# Patient Record
Sex: Male | Born: 1960 | Race: White | Hispanic: No | Marital: Married | Smoking: Former smoker
Health system: Southern US, Community
[De-identification: ages and names within clinical notes are randomized; demographics above are authoritative.]

## PROBLEM LIST (undated history)

## (undated) DIAGNOSIS — I209 Angina pectoris, unspecified: Secondary | ICD-10-CM

## (undated) DIAGNOSIS — I252 Old myocardial infarction: Secondary | ICD-10-CM

## (undated) DIAGNOSIS — K8020 Calculus of gallbladder without cholecystitis without obstruction: Secondary | ICD-10-CM

## (undated) DIAGNOSIS — I10 Essential (primary) hypertension: Secondary | ICD-10-CM

## (undated) DIAGNOSIS — I2510 Atherosclerotic heart disease of native coronary artery without angina pectoris: Secondary | ICD-10-CM

## (undated) DIAGNOSIS — Z8249 Family history of ischemic heart disease and other diseases of the circulatory system: Secondary | ICD-10-CM

## (undated) DIAGNOSIS — I219 Acute myocardial infarction, unspecified: Secondary | ICD-10-CM

## (undated) DIAGNOSIS — Z955 Presence of coronary angioplasty implant and graft: Secondary | ICD-10-CM

## (undated) DIAGNOSIS — E785 Hyperlipidemia, unspecified: Secondary | ICD-10-CM

## (undated) DIAGNOSIS — I739 Peripheral vascular disease, unspecified: Secondary | ICD-10-CM

## (undated) DIAGNOSIS — F17200 Nicotine dependence, unspecified, uncomplicated: Secondary | ICD-10-CM

## (undated) HISTORY — DX: Family history of ischemic heart disease and other diseases of the circulatory system: Z82.49

## (undated) HISTORY — DX: Presence of coronary angioplasty implant and graft: Z95.5

## (undated) HISTORY — DX: Hyperlipidemia, unspecified: E78.5

## (undated) HISTORY — DX: Calculus of gallbladder without cholecystitis without obstruction: K80.20

## (undated) HISTORY — PX: APPENDECTOMY: SHX54

## (undated) HISTORY — PX: CORONARY ANGIOPLASTY: SHX604

## (undated) HISTORY — DX: Old myocardial infarction: I25.2

## (undated) HISTORY — DX: Angina pectoris, unspecified: I20.9

## (undated) HISTORY — DX: Nicotine dependence, unspecified, uncomplicated: F17.200

## (undated) HISTORY — DX: Peripheral vascular disease, unspecified: I73.9

---

## 2003-07-02 ENCOUNTER — Ambulatory Visit: Admission: RE | Admit: 2003-07-02 | Discharge: 2003-07-02 | Payer: Self-pay | Admitting: *Deleted

## 2011-09-17 ENCOUNTER — Other Ambulatory Visit: Payer: Self-pay | Admitting: Neurosurgery

## 2011-09-18 ENCOUNTER — Encounter (HOSPITAL_COMMUNITY): Payer: Self-pay | Admitting: Pharmacy Technician

## 2011-09-25 ENCOUNTER — Encounter (HOSPITAL_COMMUNITY): Payer: Self-pay

## 2011-09-25 ENCOUNTER — Encounter (HOSPITAL_COMMUNITY)
Admission: RE | Admit: 2011-09-25 | Discharge: 2011-09-25 | Disposition: A | Discharge status: Home / Self Care | Payer: Worker's Compensation | Source: Ambulatory Visit | Attending: Neurosurgery | Admitting: Neurosurgery

## 2011-09-25 ENCOUNTER — Other Ambulatory Visit: Payer: Self-pay

## 2011-09-25 ENCOUNTER — Encounter (HOSPITAL_COMMUNITY)
Admission: RE | Admit: 2011-09-25 | Discharge: 2011-09-25 | Disposition: A | Discharge status: Home / Self Care | Payer: Worker's Compensation | Source: Ambulatory Visit | Attending: Anesthesiology | Admitting: Anesthesiology

## 2011-09-25 HISTORY — DX: Essential (primary) hypertension: I10

## 2011-09-25 HISTORY — DX: Atherosclerotic heart disease of native coronary artery without angina pectoris: I25.10

## 2011-09-25 HISTORY — DX: Acute myocardial infarction, unspecified: I21.9

## 2011-09-25 HISTORY — DX: Hyperlipidemia, unspecified: E78.5

## 2011-09-25 LAB — CBC
HCT: 43 % (ref 39.0–52.0)
Hemoglobin: 14.8 g/dL (ref 13.0–17.0)
MCH: 33.5 pg (ref 26.0–34.0)
MCHC: 34.4 g/dL (ref 30.0–36.0)
MCV: 97.3 fL (ref 78.0–100.0)
Platelets: 221 10*3/uL (ref 150–400)
RBC: 4.42 MIL/uL (ref 4.22–5.81)
RDW: 12.8 % (ref 11.5–15.5)
WBC: 7.1 10*3/uL (ref 4.0–10.5)

## 2011-09-25 LAB — SURGICAL PCR SCREEN
MRSA, PCR: NEGATIVE
Staphylococcus aureus: NEGATIVE

## 2011-09-25 LAB — BASIC METABOLIC PANEL
BUN: 9 mg/dL (ref 6–23)
CO2: 28 mEq/L (ref 19–32)
Calcium: 9.6 mg/dL (ref 8.4–10.5)
Chloride: 104 mEq/L (ref 96–112)
Creatinine, Ser: 0.91 mg/dL (ref 0.50–1.35)
GFR calc Af Amer: >90 mL/min (ref >90)
GFR calc non Af Amer: >90 mL/min (ref >90)
Glucose, Bld: 120 mg/dL — ABNORMAL HIGH (ref 70–99)
Potassium: 5.1 mEq/L (ref 3.5–5.1)
Sodium: 139 mEq/L (ref 135–145)

## 2011-09-25 NOTE — Pre-Procedure Instructions (Signed)
20 Jonathan Fleming [facility]  09/25/2011   Your procedure is scheduled on:  October 01, 2011  Report to [facility] at 0530 AM.  Call this number if you have problems the morning of surgery: [phone]   Remember:   Do not eat food:After Midnight.  May have clear liquids: up to 4 Hours before arrival.  Clear liquids include soda, tea, black coffee, apple or grape juice, broth.  Take these medicines the morning of surgery with A SIP OF WATER: Dilaudid, Metoprolol   STOP Lecithin, Multiple Vitamins, Fish Oil, Aspirin today  Do not wear jewelry, make-up or nail polish.  Do not wear lotions, powders, or perfumes. You may wear deodorant.  Do not shave 48 hours prior to surgery.  Do not bring valuables to the hospital.  Contacts, dentures or bridgework may not be worn into surgery.  Leave suitcase in the car. After surgery it may be brought to your room.  For patients admitted to the hospital, checkout time is 11:00 AM the day of discharge.   Patients discharged the day of surgery will not be allowed to drive home.  Name and phone number of your driver: Jonathan Fleming [phone]  Special Instructions: CHG Shower Use Special Wash: 1/2 bottle night before surgery and 1/2 bottle morning of surgery.   Please read over the following fact sheets that you were given: Pain Booklet, Coughing and Deep Breathing, MRSA Information and Surgical Site Infection Prevention

## 2011-09-25 NOTE — Progress Notes (Signed)
Records requested from [facility] in [place] most recent notes from ~ 1.5 years ago, Stress Test requested from [facility], records requested from pt's PCP office Dr. [facility] most recent visit notes.

## 2011-10-01 ENCOUNTER — Encounter (HOSPITAL_COMMUNITY): Payer: Self-pay | Admitting: Anesthesiology

## 2011-10-01 ENCOUNTER — Ambulatory Visit (HOSPITAL_COMMUNITY)
Admission: RE | Admit: 2011-10-01 | Discharge: 2011-10-01 | Disposition: A | Discharge status: Home / Self Care | Payer: Worker's Compensation | Source: Ambulatory Visit | Attending: Neurosurgery | Admitting: Neurosurgery

## 2011-10-01 ENCOUNTER — Encounter (HOSPITAL_COMMUNITY)
Admission: RE | Disposition: A | Discharge status: Home / Self Care | Payer: Self-pay | Source: Ambulatory Visit | Attending: Neurosurgery

## 2011-10-01 ENCOUNTER — Ambulatory Visit (HOSPITAL_COMMUNITY): Payer: Worker's Compensation

## 2011-10-01 ENCOUNTER — Encounter (HOSPITAL_COMMUNITY): Payer: Self-pay | Admitting: *Deleted

## 2011-10-01 ENCOUNTER — Ambulatory Visit (HOSPITAL_COMMUNITY): Payer: Worker's Compensation | Admitting: Anesthesiology

## 2011-10-01 DIAGNOSIS — E785 Hyperlipidemia, unspecified: Secondary | ICD-10-CM | POA: Insufficient documentation

## 2011-10-01 DIAGNOSIS — I10 Essential (primary) hypertension: Secondary | ICD-10-CM | POA: Insufficient documentation

## 2011-10-01 DIAGNOSIS — M5126 Other intervertebral disc displacement, lumbar region: Secondary | ICD-10-CM | POA: Insufficient documentation

## 2011-10-01 DIAGNOSIS — I252 Old myocardial infarction: Secondary | ICD-10-CM | POA: Insufficient documentation

## 2011-10-01 DIAGNOSIS — I2510 Atherosclerotic heart disease of native coronary artery without angina pectoris: Secondary | ICD-10-CM | POA: Insufficient documentation

## 2011-10-01 DIAGNOSIS — Z23 Encounter for immunization: Secondary | ICD-10-CM | POA: Insufficient documentation

## 2011-10-01 DIAGNOSIS — M5116 Intervertebral disc disorders with radiculopathy, lumbar region: Secondary | ICD-10-CM

## 2011-10-01 HISTORY — PX: LUMBAR LAMINECTOMY/DECOMPRESSION MICRODISCECTOMY: SHX5026

## 2011-10-01 SURGERY — LUMBAR LAMINECTOMY/DECOMPRESSION MICRODISCECTOMY 1 LEVEL
Anesthesia: General | Site: Back | Laterality: Right | Wound class: Clean

## 2011-10-01 MED ORDER — PANTOPRAZOLE SODIUM 40 MG PO TBEC: 80.0000 mg | DELAYED_RELEASE_TABLET | Freq: Every day | ORAL | Status: DC

## 2011-10-01 MED ORDER — ADULT MULTIVITAMIN W/MINERALS CH: 1.0000 | ORAL_TABLET | Freq: Every day | ORAL | Status: DC

## 2011-10-01 MED ORDER — FENTANYL CITRATE 0.05 MG/ML IJ SOLN
INTRAMUSCULAR | Status: DC | PRN
  Administered 2011-10-01 (×2): 100 ug via INTRAVENOUS
  Administered 2011-10-01: 50 ug via INTRAVENOUS

## 2011-10-01 MED ORDER — CEFAZOLIN SODIUM 1-5 GM-% IV SOLN
INTRAVENOUS | Status: AC
  Administered 2011-10-01: 1 g via INTRAVENOUS
  Filled 2011-10-01: qty 50

## 2011-10-01 MED ORDER — EZETIMIBE-SIMVASTATIN 10-20 MG PO TABS
1.0000 | ORAL_TABLET | Freq: Every day | ORAL | Status: DC
  Filled 2011-10-01: qty 1

## 2011-10-01 MED ORDER — SODIUM CHLORIDE 0.9 % IJ SOLN
3.0000 mL | Freq: Two times a day (BID) | INTRAMUSCULAR | Status: DC
  Administered 2011-10-01: 3 mL via INTRAVENOUS

## 2011-10-01 MED ORDER — CEFAZOLIN SODIUM 1-5 GM-% IV SOLN: 1.0000 g | INTRAVENOUS | Status: DC

## 2011-10-01 MED ORDER — SODIUM CHLORIDE 0.9 % IJ SOLN: 3.0000 mL | INTRAMUSCULAR | Status: DC | PRN

## 2011-10-01 MED ORDER — LACTATED RINGERS IV SOLN
INTRAVENOUS | Status: DC | PRN
  Administered 2011-10-01 (×2): via INTRAVENOUS

## 2011-10-01 MED ORDER — DSS 100 MG PO CAPS: 100.0000 mg | ORAL_CAPSULE | Freq: Two times a day (BID) | ORAL | Status: AC

## 2011-10-01 MED ORDER — PROPOFOL 10 MG/ML IV EMUL
INTRAVENOUS | Status: DC | PRN
  Administered 2011-10-01: 200 mg via INTRAVENOUS

## 2011-10-01 MED ORDER — NEOSTIGMINE METHYLSULFATE 1 MG/ML IJ SOLN
INTRAMUSCULAR | Status: DC | PRN
  Administered 2011-10-01: 4 mg via INTRAVENOUS

## 2011-10-01 MED ORDER — SODIUM CHLORIDE 0.9 % IV SOLN
INTRAVENOUS | Status: AC
  Filled 2011-10-01: qty 500

## 2011-10-01 MED ORDER — METOPROLOL TARTRATE 100 MG PO TABS
100.0000 mg | ORAL_TABLET | Freq: Two times a day (BID) | ORAL | Status: DC
  Filled 2011-10-01: qty 1

## 2011-10-01 MED ORDER — DOCUSATE SODIUM 100 MG PO CAPS: 100.0000 mg | ORAL_CAPSULE | Freq: Two times a day (BID) | ORAL | Status: DC

## 2011-10-01 MED ORDER — ACETAMINOPHEN 650 MG RE SUPP: 650.0000 mg | RECTAL | Status: DC | PRN

## 2011-10-01 MED ORDER — HEMOSTATIC AGENTS (NO CHARGE) OPTIME
TOPICAL | Status: DC | PRN
  Administered 2011-10-01: 1 via TOPICAL

## 2011-10-01 MED ORDER — SODIUM CHLORIDE 0.9 % IR SOLN
Status: DC | PRN
  Administered 2011-10-01: 08:00:00

## 2011-10-01 MED ORDER — ZOLPIDEM TARTRATE 10 MG PO TABS: 10.0000 mg | ORAL_TABLET | Freq: Every evening | ORAL | Status: DC | PRN

## 2011-10-01 MED ORDER — ENALAPRIL MALEATE 10 MG PO TABS: 10.0000 mg | ORAL_TABLET | Freq: Every day | ORAL | Status: DC

## 2011-10-01 MED ORDER — THROMBIN 5000 UNITS EX KIT
PACK | CUTANEOUS | Status: DC | PRN
  Administered 2011-10-01 (×2): 5000 [IU] via TOPICAL

## 2011-10-01 MED ORDER — CEFAZOLIN SODIUM 1-5 GM-% IV SOLN
1.0000 g | Freq: Three times a day (TID) | INTRAVENOUS | Status: DC
  Filled 2011-10-01: qty 50

## 2011-10-01 MED ORDER — MORPHINE SULFATE 4 MG/ML IJ SOLN
1.0000 mg | INTRAMUSCULAR | Status: DC | PRN
  Administered 2011-10-01: 4 mg via INTRAVENOUS
  Filled 2011-10-01: qty 1

## 2011-10-01 MED ORDER — GLYCOPYRROLATE 0.2 MG/ML IJ SOLN
INTRAMUSCULAR | Status: DC | PRN
  Administered 2011-10-01: .6 mg via INTRAVENOUS

## 2011-10-01 MED ORDER — LACTATED RINGERS IV SOLN: INTRAVENOUS | Status: DC

## 2011-10-01 MED ORDER — PNEUMOCOCCAL VAC POLYVALENT 25 MCG/0.5ML IJ INJ
0.5000 mL | INJECTION | Freq: Once | INTRAMUSCULAR | Status: AC
  Administered 2011-10-01: 0.5 mL via INTRAMUSCULAR
  Filled 2011-10-01: qty 0.5

## 2011-10-01 MED ORDER — PHENOL 1.4 % MT LIQD: 1.0000 | OROMUCOSAL | Status: DC | PRN

## 2011-10-01 MED ORDER — DIAZEPAM 5 MG PO TABS: 5.0000 mg | ORAL_TABLET | Freq: Four times a day (QID) | ORAL | Status: AC | PRN

## 2011-10-01 MED ORDER — MENTHOL 3 MG MT LOZG: 1.0000 | LOZENGE | OROMUCOSAL | Status: DC | PRN

## 2011-10-01 MED ORDER — HYDROMORPHONE HCL PF 1 MG/ML IJ SOLN: 0.2500 mg | INTRAMUSCULAR | Status: DC | PRN

## 2011-10-01 MED ORDER — ONDANSETRON HCL 4 MG/2ML IJ SOLN: 4.0000 mg | Freq: Four times a day (QID) | INTRAMUSCULAR | Status: DC | PRN

## 2011-10-01 MED ORDER — ROCURONIUM BROMIDE 100 MG/10ML IV SOLN
INTRAVENOUS | Status: DC | PRN
  Administered 2011-10-01: 50 mg via INTRAVENOUS

## 2011-10-01 MED ORDER — BACITRACIN ZINC 500 UNIT/GM EX OINT
TOPICAL_OINTMENT | CUTANEOUS | Status: DC | PRN
  Administered 2011-10-01: 1 via TOPICAL

## 2011-10-01 MED ORDER — LIDOCAINE HCL (CARDIAC) 20 MG/ML IV SOLN
INTRAVENOUS | Status: DC | PRN
  Administered 2011-10-01: 50 mg via INTRAVENOUS

## 2011-10-01 MED ORDER — ACETAMINOPHEN 325 MG PO TABS: 650.0000 mg | ORAL_TABLET | ORAL | Status: DC | PRN

## 2011-10-01 MED ORDER — OXYCODONE-ACETAMINOPHEN 5-325 MG PO TABS
1.0000 | ORAL_TABLET | ORAL | Status: DC | PRN
  Administered 2011-10-01: 2 via ORAL
  Filled 2011-10-01: qty 2

## 2011-10-01 MED ORDER — MIDAZOLAM HCL 5 MG/5ML IJ SOLN
INTRAMUSCULAR | Status: DC | PRN
  Administered 2011-10-01: 2 mg via INTRAVENOUS

## 2011-10-01 MED ORDER — BACITRACIN 50000 UNITS IM SOLR
INTRAMUSCULAR | Status: AC
  Filled 2011-10-01: qty 1

## 2011-10-01 MED ORDER — ONDANSETRON HCL 4 MG/2ML IJ SOLN
INTRAMUSCULAR | Status: DC | PRN
  Administered 2011-10-01: 4 mg via INTRAVENOUS

## 2011-10-01 MED ORDER — DIAZEPAM 5 MG PO TABS: 5.0000 mg | ORAL_TABLET | Freq: Four times a day (QID) | ORAL | Status: DC | PRN

## 2011-10-01 MED ORDER — OXYCODONE-ACETAMINOPHEN 10-325 MG PO TABS: 1.0000 | ORAL_TABLET | ORAL | Status: AC | PRN

## 2011-10-01 MED ORDER — BUPIVACAINE-EPINEPHRINE PF 0.5-1:200000 % IJ SOLN
INTRAMUSCULAR | Status: DC | PRN
  Administered 2011-10-01: 10 mL

## 2011-10-01 MED ORDER — ONDANSETRON HCL 4 MG/2ML IJ SOLN: 4.0000 mg | INTRAMUSCULAR | Status: DC | PRN

## 2011-10-01 MED ORDER — 0.9 % SODIUM CHLORIDE (POUR BTL) OPTIME
TOPICAL | Status: DC | PRN
  Administered 2011-10-01: 1000 mL

## 2011-10-01 MED ORDER — VANCOMYCIN HCL IN DEXTROSE 1-5 GM/200ML-% IV SOLN
1000.0000 mg | Freq: Two times a day (BID) | INTRAVENOUS | Status: DC
  Filled 2011-10-01 (×2): qty 200

## 2011-10-01 SURGICAL SUPPLY — 53 items
BAG DECANTER FOR FLEXI CONT (MISCELLANEOUS) ×2 IMPLANT
BENZOIN TINCTURE PRP APPL 2/3 (GAUZE/BANDAGES/DRESSINGS) ×2 IMPLANT
BLADE SURG ROTATE 9660 (MISCELLANEOUS) ×2 IMPLANT
BRUSH SCRUB EZ PLAIN DRY (MISCELLANEOUS) ×2 IMPLANT
BUR ACORN 6.0 (BURR) ×2 IMPLANT
BUR MATCHSTICK NEURO 3.0 LAGG (BURR) ×2 IMPLANT
CANISTER SUCTION 2500CC (MISCELLANEOUS) ×2 IMPLANT
CLOTH BEACON ORANGE TIMEOUT ST (SAFETY) ×2 IMPLANT
CONT SPEC 4OZ CLIKSEAL STRL BL (MISCELLANEOUS) ×2 IMPLANT
DRAPE LAPAROTOMY 100X72X124 (DRAPES) ×2 IMPLANT
DRAPE MICROSCOPE LEICA (MISCELLANEOUS) ×2 IMPLANT
DRAPE POUCH INSTRU U-SHP 10X18 (DRAPES) ×2 IMPLANT
DRAPE SURG 17X23 STRL (DRAPES) ×8 IMPLANT
ELECT BLADE 4.0 EZ CLEAN MEGAD (MISCELLANEOUS) ×2
ELECT REM PT RETURN 9FT ADLT (ELECTROSURGICAL) ×2
ELECTRODE BLDE 4.0 EZ CLN MEGD (MISCELLANEOUS) ×1 IMPLANT
ELECTRODE REM PT RTRN 9FT ADLT (ELECTROSURGICAL) ×1 IMPLANT
GAUZE SPONGE 4X4 16PLY XRAY LF (GAUZE/BANDAGES/DRESSINGS) IMPLANT
GLOVE BIO SURGEON STRL SZ8.5 (GLOVE) ×2 IMPLANT
GLOVE BIOGEL PI IND STRL 8.5 (GLOVE) ×1 IMPLANT
GLOVE BIOGEL PI INDICATOR 8.5 (GLOVE) ×1
GLOVE ECLIPSE 7.5 STRL STRAW (GLOVE) ×2 IMPLANT
GLOVE EXAM NITRILE LRG STRL (GLOVE) IMPLANT
GLOVE EXAM NITRILE MD LF STRL (GLOVE) ×2 IMPLANT
GLOVE EXAM NITRILE XL STR (GLOVE) IMPLANT
GLOVE EXAM NITRILE XS STR PU (GLOVE) IMPLANT
GLOVE SS BIOGEL STRL SZ 8 (GLOVE) ×1 IMPLANT
GLOVE SUPERSENSE BIOGEL SZ 8 (GLOVE) ×1
GLOVE SURG SS PI 8.0 STRL IVOR (GLOVE) ×6 IMPLANT
GOWN BRE IMP SLV AUR LG STRL (GOWN DISPOSABLE) IMPLANT
GOWN BRE IMP SLV AUR XL STRL (GOWN DISPOSABLE) ×4 IMPLANT
GOWN STRL REIN 2XL LVL4 (GOWN DISPOSABLE) ×2 IMPLANT
KIT BASIN OR (CUSTOM PROCEDURE TRAY) ×2 IMPLANT
KIT ROOM TURNOVER OR (KITS) ×2 IMPLANT
NEEDLE HYPO 21X1.5 SAFETY (NEEDLE) IMPLANT
NEEDLE HYPO 22GX1.5 SAFETY (NEEDLE) ×4 IMPLANT
NS IRRIG 1000ML POUR BTL (IV SOLUTION) ×2 IMPLANT
PACK LAMINECTOMY NEURO (CUSTOM PROCEDURE TRAY) ×2 IMPLANT
PAD ARMBOARD 7.5X6 YLW CONV (MISCELLANEOUS) ×6 IMPLANT
PATTIES SURGICAL .5 X1 (DISPOSABLE) IMPLANT
RUBBERBAND STERILE (MISCELLANEOUS) ×4 IMPLANT
SPONGE GAUZE 4X4 12PLY (GAUZE/BANDAGES/DRESSINGS) ×2 IMPLANT
SPONGE SURGIFOAM ABS GEL SZ50 (HEMOSTASIS) ×2 IMPLANT
STRIP CLOSURE SKIN 1/2X4 (GAUZE/BANDAGES/DRESSINGS) ×2 IMPLANT
SUT VIC AB 1 CT1 18XBRD ANBCTR (SUTURE) ×1 IMPLANT
SUT VIC AB 1 CT1 8-18 (SUTURE) ×1
SUT VIC AB 2-0 CP2 18 (SUTURE) ×2 IMPLANT
SYR 20CC LL (SYRINGE) IMPLANT
SYR 20ML ECCENTRIC (SYRINGE) ×2 IMPLANT
TAPE CLOTH SURG 4X10 WHT LF (GAUZE/BANDAGES/DRESSINGS) ×2 IMPLANT
TOWEL OR 17X24 6PK STRL BLUE (TOWEL DISPOSABLE) ×2 IMPLANT
TOWEL OR 17X26 10 PK STRL BLUE (TOWEL DISPOSABLE) ×2 IMPLANT
WATER STERILE IRR 1000ML POUR (IV SOLUTION) ×2 IMPLANT

## 2011-10-01 NOTE — Discharge Summary (Signed)
Physician Discharge Summary  Patient ID: Jonathan Fleming MRN: [id number] DOB/AGE: 19-May-1961 51 y.o.  Admit date: 10/01/2011 Discharge date: 10/01/2011  Admission Diagnoses: L2-3 ruptured disc.  Discharge Diagnoses: The same Active Problems:  * No active hospital problems. *    Discharged Condition: Good  Hospital Course: The patient was admitted to [facility] on 10/01/2011. On that day I performed a right L2-3 discectomy. The surgery were well. The remainder of the patient's postoperative course was unremarkable and he requested discharge to home on 10/01/2011. He was discharged  Consults: None Significant Diagnostic Studies: None Treatments: L2-3 discectomy Discharge Exam: Blood pressure 159/83, pulse 72, temperature 98 F (36.7 C), temperature source Oral, resp. rate 20, SpO2 100.00%. The patient is alert and oriented. His dressing is clean and dry. His strength is normal.  Disposition: Home  Discharge Orders    Future Orders Please Complete By Expires   Diet - low sodium heart healthy      Increase activity slowly      Discharge instructions      Comments:   Call [phone] for a followup appointment.    Remove dressing in 72 hours      Call MD for:  temperature >100.4      Call MD for:  persistant nausea and vomiting      Call MD for:  severe uncontrolled pain      Call MD for:  redness, tenderness, or signs of infection (pain, swelling, redness, odor or green/yellow discharge around incision site)      Call MD for:  difficulty breathing, headache or visual disturbances      Call MD for:  hives      Call MD for:  persistant dizziness or light-headedness      Call MD for:  extreme fatigue        Medication List  As of 10/01/2011  2:30 PM   STOP taking these medications         HYDROmorphone 2 MG tablet         TAKE these medications         aspirin 325 MG tablet   Take 325 mg by mouth daily.      diazepam 5 MG tablet   Commonly known as: VALIUM     Take 1 tablet (5 mg total) by mouth every 6 (six) hours as needed.      DSS 100 MG Caps   Take 100 mg by mouth 2 (two) times daily.      enalapril 10 MG tablet   Commonly known as: VASOTEC   Take 10 mg by mouth daily.      esomeprazole 40 MG capsule   Commonly known as: NEXIUM   Take 40 mg by mouth daily before breakfast.      ezetimibe-simvastatin 10-20 MG per tablet   Commonly known as: VYTORIN   Take 1 tablet by mouth at bedtime.      Fish Oil 1200 MG Caps   Take 2 capsules by mouth daily.      Lecithin 1000 MG Chew   Chew 1 tablet by mouth daily.      metoprolol 100 MG tablet   Commonly known as: LOPRESSOR   Take 100 mg by mouth 2 (two) times daily.      mulitivitamin with minerals Tabs   Take 1 tablet by mouth daily.      oxyCODONE-acetaminophen 10-325 MG per tablet   Commonly known as: PERCOCET   Take 1  tablet by mouth every 4 (four) hours as needed for pain.             SignedCristi Loron 10/01/2011, 2:30 PM

## 2011-10-01 NOTE — Anesthesia Preprocedure Evaluation (Signed)
Anesthesia Evaluation  Patient identified by MRN, date of birth, ID band Patient awake    Reviewed: Allergy & Precautions, H&P , NPO status , Patient's Chart, lab work & pertinent test results  Airway Mallampati: II  Neck ROM: full    Dental   Pulmonary Current Smoker,          Cardiovascular hypertension, + CAD and + Past MI     Neuro/Psych    GI/Hepatic   Endo/Other    Renal/GU      Musculoskeletal   Abdominal   Peds  Hematology   Anesthesia Other Findings   Reproductive/Obstetrics                           Anesthesia Physical Anesthesia Plan  ASA: III  Anesthesia Plan: General   Post-op Pain Management:    Induction: Intravenous  Airway Management Planned: Oral ETT  Additional Equipment:   Intra-op Plan:   Post-operative Plan: Extubation in OR  Informed Consent: I have reviewed the patients History and Physical, chart, labs and discussed the procedure including the risks, benefits and alternatives for the proposed anesthesia with the patient or authorized representative who has indicated his/her understanding and acceptance.     Plan Discussed with: CRNA and Surgeon  Anesthesia Plan Comments:         Anesthesia Quick Evaluation

## 2011-10-01 NOTE — Anesthesia Postprocedure Evaluation (Signed)
Anesthesia Post Note  Patient: Jonathan Fleming [facility]  Procedure(s) Performed: Procedure(s) (LRB): LUMBAR LAMINECTOMY/DECOMPRESSION MICRODISCECTOMY 1 LEVEL (Right)  Anesthesia type: General  Patient location: PACU  Post pain: Pain level controlled and Adequate analgesia  Post assessment: Post-op Vital signs reviewed, Patient's Cardiovascular Status Stable, Respiratory Function Stable, Patent Airway and Pain level controlled  Last Vitals:  Filed Vitals:   10/01/11 0925  BP:   Pulse:   Temp: 36.5 C  Resp:     Post vital signs: Reviewed and stable  Level of consciousness: awake, alert  and oriented  Complications: No apparent anesthesia complications

## 2011-10-01 NOTE — H&P (Signed)
Subjective: Patient is a 51 year old white male who had chronic back pain. He developed back and right leg pain. He has failed medical management. He was worked up with a lumbar MRI which demonstrated a herniated disc at L2-3 on the right. I discussed the various treatment options with the patient including surgery. The patient has weighed the risks, benefits and alternatives surgery decided proceed with a right L2-3 discectomy.  Past Medical History  Diagnosis Date  . Coronary artery disease   . Myocardial infarction     age 60 sees [facility] in [place], Stress test done at [facility], Dr. Benedetto Goad has records  . Hypertension   . Hyperlipidemia     Past Surgical History  Procedure Date  . Coronary angioplasty     Stents x 2 age 31  . Appendectomy     Allergies  Allergen Reactions  . Penicillins Nausea And Vomiting    History  Substance Use Topics  . Smoking status: Current Everyday Smoker -- 0.5 packs/day for 20 years    Types: Cigarettes  . Smokeless tobacco: Never Used  . Alcohol Use: No    Family History  Problem Relation Age of Onset  . Anesthesia problems Neg Hx    Prior to Admission medications   Medication Sig Start Date End Date Taking? Authorizing Provider  aspirin 325 MG tablet Take 325 mg by mouth daily.   Yes Historical Provider, MD  enalapril (VASOTEC) 10 MG tablet Take 10 mg by mouth daily.   Yes Historical Provider, MD  esomeprazole (NEXIUM) 40 MG capsule Take 40 mg by mouth daily before breakfast.   Yes Historical Provider, MD  ezetimibe-simvastatin (VYTORIN) 10-20 MG per tablet Take 1 tablet by mouth at bedtime.   Yes Historical Provider, MD  Lecithin 1000 MG CHEW Chew 1 tablet by mouth daily.   Yes Historical Provider, MD  metoprolol (LOPRESSOR) 100 MG tablet Take 100 mg by mouth 2 (two) times daily.   Yes Historical Provider, MD  Multiple Vitamin (MULITIVITAMIN WITH MINERALS) TABS Take 1 tablet by mouth daily.   Yes Historical Provider,  MD  Omega-3 Fatty Acids (FISH OIL) 1200 MG CAPS Take 2 capsules by mouth daily.   Yes Historical Provider, MD  HYDROmorphone (DILAUDID) 2 MG tablet Take 2 mg by mouth every 4 (four) hours as needed. For pain    Historical Provider, MD     Review of Systems  Positive ROS: Negative except as above  All other systems have been reviewed and were otherwise negative with the exception of those mentioned in the HPI and as above.  Objective: Vital signs in last 24 hours: Temp:  [98 F (36.7 C)] 98 F (36.7 C) (02/18 0626) Pulse Rate:  [58] 58  (02/18 0626) Resp:  [18] 18  (02/18 0626) BP: (131)/(84) 131/84 mmHg (02/18 0626) SpO2:  [98 %] 98 % (02/18 0626)  General Appearance: Alert, cooperative, no distress, appears stated age Head: Normocephalic, without obvious abnormality, atraumatic Eyes: PERRL, conjunctiva/corneas clear, EOM's intact, fundi benign, both eyes      Ears: Normal TM's and external ear canals, both ears Throat: Lips, mucosa, and tongue normal; teeth and gums normal Neck: Supple, symmetrical, trachea midline, no adenopathy; thyroid: No enlargement/tenderness/nodules; no carotid bruit or JVD Back: Symmetric, no curvature, ROM normal, no CVA tenderness Lungs: Clear to auscultation bilaterally, respirations unlabored Heart: Regular rate and rhythm, S1 and S2 normal, no murmur, rub or gallop Abdomen: Soft, non-tender, bowel sounds active all four quadrants, no masses, no organomegaly  Extremities: Extremities normal, atraumatic, no cyanosis or edema Pulses: 2+ and symmetric all extremities Skin: Skin color, texture, turgor normal, no rashes or lesions  NEUROLOGIC:   Mental status: alert and oriented, no aphasia, good attention span, Fund of knowledge/ memory ok Motor Exam - grossly normal Sensory Exam - grossly normal Reflexes:  Coordination - grossly normal Gait - grossly normal Balance - grossly normal Cranial Nerves: I: smell Not tested  II: visual acuity  OS:  Normal    OD: Normal   II: visual fields Full to confrontation  II: pupils Equal, round, reactive to light  III,VII: ptosis None  III,IV,VI: extraocular muscles  Full ROM  V: mastication Normal  V: facial light touch sensation  Normal  V,VII: corneal reflex  Present  VII: facial muscle function - upper  Normal  VII: facial muscle function - lower Normal  VIII: hearing Not tested  IX: soft palate elevation  Normal  IX,X: gag reflex Present  XI: trapezius strength  5/5  XI: sternocleidomastoid strength 5/5  XI: neck flexion strength  5/5  XII: tongue strength  Normal    Data Review Lab Results  Component Value Date   WBC 7.1 09/25/2011   HGB 14.8 09/25/2011   HCT 43.0 09/25/2011   MCV 97.3 09/25/2011   PLT 221 09/25/2011   Lab Results  Component Value Date   NA 139 09/25/2011   K 5.1 09/25/2011   CL 104 09/25/2011   CO2 28 09/25/2011   BUN 9 09/25/2011   CREATININE 0.91 09/25/2011   GLUCOSE 120* 09/25/2011   No results found for this basename: INR, PROTIME    Assessment/Plan: Right L2-3 herniated discs: I discussed situation with the patient. I reviewed his MR scan with them and pointed out the abnormalities. We have discussed the various treatment options including a right L2-3 discectomy. I described the surgery to him. I've shown her surgical models. We have discussed the risks, benefits, alternatives and likelihood of achieving our goals with surgery. I have answered all his questions. He was to proceed with the operation.   Carylon Tamburro D 10/01/2011 7:26 AM

## 2011-10-01 NOTE — Transfer of Care (Signed)
Immediate Anesthesia Transfer of Care Note  Patient: Jonathan Fleming [facility]  Procedure(s) Performed: Procedure(s) (LRB): LUMBAR LAMINECTOMY/DECOMPRESSION MICRODISCECTOMY 1 LEVEL (Right)  Patient Location: PACU  Anesthesia Type: General  Level of Consciousness: awake, alert  and oriented  Airway & Oxygen Therapy: Patient Spontanous Breathing and Patient connected to face mask oxygen  Post-op Assessment: Report given to PACU RN  Post vital signs: Reviewed and stable  Complications: No apparent anesthesia complications

## 2011-10-01 NOTE — Discharge Instructions (Signed)
Herniated Disk The bones of your spinal column (vertebrae) protect your spinal cord and nerves that go into your arms and legs. The vertebrae are separated by disks that cushion the spinal column and put space between your vertebrae. This allows movement between the vertebrae, which allows you to bend, rotate, and move your body from side to side. Sometimes, the disks move out of place (herniate) or break open (rupture) from injury or strain. The most common area for a disk herniation is in the lower back (lumbar area). Sometimes herniation occurs in the neck (cervical) disks.  CAUSES  As we grow older, the strong, fibrous cords that connect the vertebrae and support and surround the disks (ligaments) start to weaken. A strain on the back may cause a break in the disk ligaments. RISK FACTORS Herniated disks occur most often in men who are aged 18 years to 35 years, usually after strenuous activity. Other risk factors include conditions present at birth (congenital) that affect the size of the lumbar spinal canal. Additionally, a narrowing of the areas where the nerves exit the spinal canal can occur as you age. SYMPTOMS  Symptoms of a herniated disk vary. You may have weakness in certain muscles. This weakness can include difficulty lifting your leg or arm, difficulty standing on your toes on one side, or difficulty squeezing tightly with one of your hands. You may have numbness. You may feel a mild tingling, dull ache, or a burning or pulsating pain. In some cases, the pain is severe enough that you are unable to move. The pain most often occurs on one side of the body. The pain often starts slowly. It may get worse:  After you sit or stand.   At night.   When you sneeze, cough, or laugh.   When you bend backwards or walk more than a few yards.  The pain, numbness, or weakness will often go away or improve a lot over a period of weeks to months. Herniated lumbar disk Symptoms of a herniated  lumbar disk may include sharp pain in one part of your leg, hip, or buttocks and numbness in other parts. You also may feel pain or numbness on the back of your calf or the top or sole of your foot. The same leg also may feel weak. Herniated cervical disk Symptoms of a herniated cervical disk may include pain when you move your neck, deep pain near or over your shoulder blade, or pain that moves to your upper arm, forearm, or fingers. DIAGNOSIS  To diagnose a herniated disk, your caregiver will perform a physical exam. Your caregiver also may perform diagnostic tests to see your disk or to test the reaction of your muscles and the function of your nerves. During the physical exam, your caregiver may ask you to:  Sit, stand, and walk. While you walk, your caregiver may ask you to try walking on your toes and then your heels.   Bend forward, backward, and sideways.   Raise your shoulders, elbow, wrist, and fingers and check your strength during these tasks.  Your caregiver will check for:  Numbness or loss of feeling.   Muscle reflexes, which may be slower or missing.   Muscle strength, which may be weaker.   Posture or the way your spine curves.  Diagnostic tests that may be done include:  A spinal X-ray exam to rule out other causes of back pain.   Magnetic resonance imaging (MRI) or computed tomography (CT) scan, which will show   if the herniated disk is pressing on your spinal canal.   Electromyography. This is sometimes used to identify the specific area of nerve involvement.  TREATMENT  Initial treatment for a herniated disk is a short period of rest with medicines for pain. Pain medicines can include nonsteroidal anti-inflammatory medicines (NSAIDs), muscle relaxants for back spasms, and (rarely) narcotic pain medicine for severe pain that does not respond to NSAID use. Bed rest is often limited to 1 or 2 days at the most because prolonged rest can delay recovery. When the herniation  involves the lower back, sitting should be avoided as much as possible because sitting increases pressure on the ruptured disk. Sometimes a soft neck collar will be prescribed for a few days to weeks to help support your neck in the case of a cervical herniation. Physical therapy is often prescribed for patients with disk disease. Physical therapists will teach you how to properly lift, dress, walk, and perform other activities. They will work on strengthening the muscles that help support your spine. In some cases, physical therapy alone is not enough to treat a herniated disk. Steroid injections along the involved nerve root may be needed to help control pain. The steroid is injected in the area of the herniated disk and helps by reducing swelling around the disk. Sometimes surgery is the best option to treat a herniated disk.  SEEK IMMEDIATE MEDICAL CARE IF:   You have numbness, tingling, weakness, or problems with the use of your arms or legs.   You have severe headaches that are not relieved with the use of medicines.   You notice a change in your bowel or bladder control.   You have increasing pain in any areas of your body.   You experience shortness of breath, dizziness, or fainting.  MAKE SURE YOU:   Understand these instructions.   Will watch your condition.   Will get help right away if you are not doing well or get worse.  Document Released: 07/27/2000 Document Revised: 04/11/2011 Document Reviewed: 03/02/2011 [facility] Patient Information 2012 [place], [place].

## 2011-10-01 NOTE — Preoperative (Signed)
Beta Blockers   Reason not to administer Beta Blockers:Not Applicable 

## 2011-10-01 NOTE — Op Note (Signed)
Brief history: The patient is a 51 year old white male who's had a history of chronic back pain. More recently has developed right leg pain consistent with a lumbar radiculopathy. He failed medical management worked up with a lumbar MRI which demonstrated a large hernia disc at L2-3 on the right. I discussed the various treatment options with the patient including surgery has decided proceed with a discectomy after weighing the risks benefits and alternatives to surgery.  Preoperative diagnosis: Right L2-3 herniated disc, spinal stenosis, lumbar discopathy, lumbago  Postoperative diagnosis: The same  Procedure: Right L2-3 Intervertebral discectomy using micro-dissection to decompress the right L2  as well as L3 nerve root  Surgeon: Dr. Delma Officer  Asst.: Dr. Orbie Hurst  Anesthesia: Gen. endotracheal  Estimated blood loss: 25 cc  Drains: None  Complications: None  Description of procedure: The patient was brought to the operating room by the anesthesia team. General endotracheal anesthesia was induced. The patient was turned to the prone position on the Wilson frame. The patient's lumbosacral region was then prepared with Betadine scrub and Betadine solution. Sterile drapes were applied.  I then injected the area to be incised with Marcaine with epinephrine solution. I then used a scalpel to make a linear midline incision over the L2-3 intervertebral disc space. I then used electrocautery to perform a right sided subperiosteal dissection exposing the spinous process and lamina of L2. We obtained intraoperative radiograph to confirm our location. I then inserted the [facility] retractor for exposure.  We then brought the operative microscope into the field. Under its magnification and illumination we completed the microdissection. I used a high-speed drill to perform a laminotomy at L2. I then used a Kerrison punches to widen the laminotomy and complete the right L2 hemilaminectomy and  removed the ligamentum flavum at L2-3. We then used microdissection to free up the thecal sac and the right L2 and L3 nerve root from the epidural tissue. I then used a Kerrison punch to perform a foraminotomy at about the right L2 and L3 nerve root. We then using the nerve root retractor to gently retract the thecal sac and the 3 nerve root medially. This exposed the intervertebral disc. The disc herniation had migrated in a cephalad direction and was mainly compressing L2 nerve root as it entered the neural foramen We identified the ruptured disc and remove it with the pituitary forceps decompressing the right L2 and L3 nerve roots.  I then palpated along the ventral surface of the thecal sac and along exit route of the right L2 and L3 nerve root and noted that the neural structures were well decompressed. This completed the decompression.  We then obtained hemostasis using bipolar electrocautery. We irrigated the wound out with bacitracin solution. We then removed the retractor. We then reapproximated the patient's thoracolumbar fascia with interrupted #1 Vicryl suture. We then reapproximated the patient's subcutaneous tissue with interrupted 3-0 Vicryl suture. We then reapproximated patient's skin with Steri-Strips and benzoin. The was then coated with bacitracin ointment. The drapes were removed. The patient was subsequently returned to the supine position where they were extubated by the anesthesia team. The patient was then transported to the postanesthesia care unit in stable condition. All sponge instrument and needle counts were correct at the end of this case.

## 2011-10-04 ENCOUNTER — Encounter (HOSPITAL_COMMUNITY): Payer: Self-pay | Admitting: Neurosurgery

## 2013-09-20 IMAGING — CR DG LUMBAR SPINE 2-3V
1 series · 1 of 1 positions shown · non-contrast
Comparison: 11/10/2004

CLINICAL DATA: Lumbar laminectomy and discectomy.

LUMBAR SPINE - 2-3 VIEW

[view not recorded]
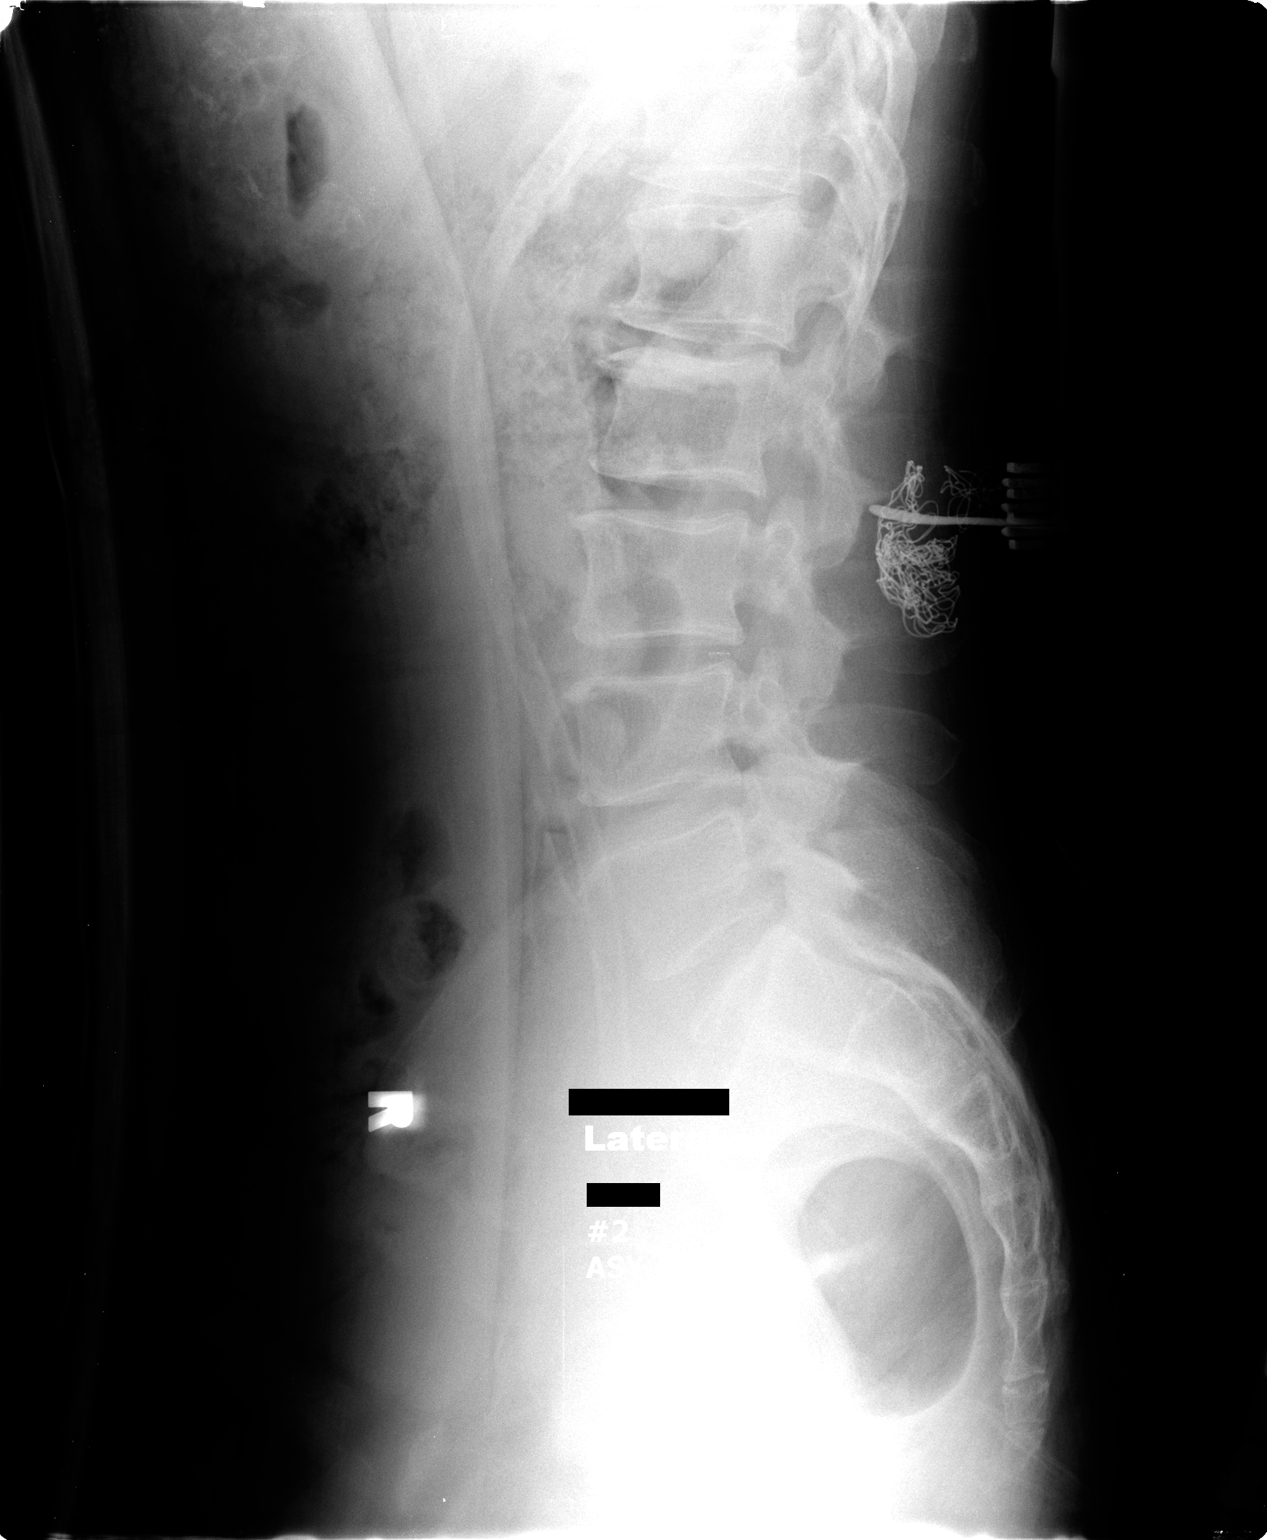

[1 of 1 positions shown; findings below may reference images not displayed]

FINDINGS: The lowest full intervertebral disk space is labeled L5-
S1.  If procedural intervention is to be performed, careful
correlation with this numbering strategy is recommended.

Intraoperative lateral view lumbar spine labeled #1 at 8 o'clock
demonstrates a needle type probe projecting over the upper portion
of the L3 vertebral body.

Image labeled #2 at 7217 hours demonstrates a blunt tip curved
probe pointing towards the L2-3 intervertebral level, with the
probe between the L2 and L3 spinous processes.
IMPRESSION: 1.  Intraoperative lumbar localization.  The second image
demonstrates a blunt tip probe at the L2-3 level.

## 2016-02-21 DIAGNOSIS — R7303 Prediabetes: Secondary | ICD-10-CM | POA: Insufficient documentation

## 2016-02-21 DIAGNOSIS — E785 Hyperlipidemia, unspecified: Secondary | ICD-10-CM | POA: Insufficient documentation

## 2016-02-21 DIAGNOSIS — I2510 Atherosclerotic heart disease of native coronary artery without angina pectoris: Secondary | ICD-10-CM

## 2016-02-21 DIAGNOSIS — I739 Peripheral vascular disease, unspecified: Secondary | ICD-10-CM | POA: Insufficient documentation

## 2016-02-21 HISTORY — DX: Hyperlipidemia, unspecified: E78.5

## 2016-02-21 HISTORY — DX: Atherosclerotic heart disease of native coronary artery without angina pectoris: I25.10

## 2016-02-21 HISTORY — DX: Peripheral vascular disease, unspecified: I73.9

## 2016-02-21 HISTORY — DX: Prediabetes: R73.03

## 2016-07-14 DIAGNOSIS — F17200 Nicotine dependence, unspecified, uncomplicated: Secondary | ICD-10-CM

## 2016-07-14 DIAGNOSIS — K8020 Calculus of gallbladder without cholecystitis without obstruction: Secondary | ICD-10-CM

## 2016-07-14 HISTORY — DX: Nicotine dependence, unspecified, uncomplicated: F17.200

## 2016-07-14 HISTORY — DX: Calculus of gallbladder without cholecystitis without obstruction: K80.20

## 2016-07-18 DIAGNOSIS — I209 Angina pectoris, unspecified: Secondary | ICD-10-CM

## 2016-07-18 DIAGNOSIS — Z8679 Personal history of other diseases of the circulatory system: Secondary | ICD-10-CM

## 2016-07-18 HISTORY — DX: Personal history of other diseases of the circulatory system: Z86.79

## 2016-07-18 HISTORY — DX: Angina pectoris, unspecified: I20.9

## 2017-03-04 DIAGNOSIS — I252 Old myocardial infarction: Secondary | ICD-10-CM

## 2017-03-04 DIAGNOSIS — Z955 Presence of coronary angioplasty implant and graft: Secondary | ICD-10-CM | POA: Insufficient documentation

## 2017-03-04 HISTORY — DX: Presence of coronary angioplasty implant and graft: Z95.5

## 2017-03-04 HISTORY — DX: Old myocardial infarction: I25.2

## 2018-01-22 DIAGNOSIS — Z1211 Encounter for screening for malignant neoplasm of colon: Secondary | ICD-10-CM

## 2018-01-22 HISTORY — DX: Encounter for screening for malignant neoplasm of colon: Z12.11

## 2018-02-07 DIAGNOSIS — Z8249 Family history of ischemic heart disease and other diseases of the circulatory system: Secondary | ICD-10-CM

## 2018-02-07 HISTORY — DX: Family history of ischemic heart disease and other diseases of the circulatory system: Z82.49

## 2018-03-25 DIAGNOSIS — R7303 Prediabetes: Secondary | ICD-10-CM

## 2018-03-25 DIAGNOSIS — I10 Essential (primary) hypertension: Secondary | ICD-10-CM

## 2018-03-25 DIAGNOSIS — E785 Hyperlipidemia, unspecified: Secondary | ICD-10-CM

## 2018-04-02 NOTE — Progress Notes (Signed)
Cardiology Office Note:    Date:  04/03/2018   ID:  Jonathan Fleming, DOB Sep 30, 1960, MRN [id number]  PCP:  Olive Bass, MD  Cardiologist:  Norman Herrlich, MD    Referring MD: No ref. provider found    ASSESSMENT:    1. CAD in native artery   2. Hyperlipidemia, unspecified hyperlipidemia type   3. LV dysfunction   4. Angina pectoris (HCC)    PLAN:    In order of problems listed above:  1. Seemingly stable asymptomatic on appropriate medical therapy but has had multiple cardiac interventions at a young age is at high risk of recurrent ischemia cigarette smoking will undergo a stress echo for risk stratification.  If abnormal would benefit from coronary angiography and further revascularization 2. Stable continue his current high intensity statin he is having mild muscle symptoms of aching with physical strenuous exertion and I would not intensify lipid-lowering therapy at this time 3. Check echocardiogram 4. Stable no recurrence   Next appointment: One year   Medication Adjustments/Labs and Tests Ordered: Current medicines are reviewed at length with the patient today.  Concerns regarding medicines are outlined above.  Orders Placed This Encounter  Procedures  . EKG 12-Lead  . ECHOCARDIOGRAM STRESS TEST   Meds ordered this encounter  Medications  . nitroGLYCERIN (NITROSTAT) 0.4 MG SL tablet    Sig: Place 1 tablet (0.4 mg total) under the tongue every 5 (five) minutes as needed for chest pain.    Dispense:  25 tablet    Refill:  12  . aspirin EC 81 MG tablet    Sig: Take 1 tablet (81 mg total) by mouth daily.    Dispense:  90 tablet    Refill:  3    Chief Complaint  Patient presents with  . Follow-up    History of Present Illness:    Jonathan Fleming is a 57 y.o. male with a hx of CAD, nonSTEMI in 1997 with PCI  Of LCF,  07/19/16 with PCI and DES to proximal LAD EF 40-45%, PAD and dyslipidemia  last seen 08/26/16. Compliance with diet, lifestyle and  medications: Yes  He does vigorous physical labor no exercise intolerance chest pain dyspnea palpitation he gets aching in his hips when he pushes himself hard it is not claudication I suspect its muscular symptoms related to his high intensity statin.  Unfortunately continues to smoke and is not ready to stop Past Medical History:  Diagnosis Date  . Angina pectoris (HCC) 07/18/2016   Added automatically from request for surgery [id number]  . Cholelithiasis 07/14/2016  . Coronary artery disease   . Current every day smoker 07/14/2016  . Dyslipidemia 02/21/2016  . Family history of premature coronary artery disease 02/07/2018   Parents, brothers, sisters very young  . H/O heart artery stent 03/04/2017  . Hyperlipidemia   . Hypertension   . Myocardial infarction [facility])    age 69 sees [facility] in [place], Stress test done at [facility], Dr. Benedetto Goad has records  . Old MI (myocardial infarction) 03/04/2017  . Peripheral vascular disease (HCC) 02/21/2016   2007 DX/RX: GEST (+) RLE claudication. s/p angioplasty Rt. Sup. Femoral artery    Past Surgical History:  Procedure Laterality Date  . APPENDECTOMY    . CORONARY ANGIOPLASTY     Stents x 2 age 74  . LUMBAR LAMINECTOMY/DECOMPRESSION MICRODISCECTOMY  10/01/2011   Procedure: LUMBAR LAMINECTOMY/DECOMPRESSION MICRODISCECTOMY 1 LEVEL;  Surgeon: Cristi Loron, MD;  Location: [facility];  Service: Neurosurgery;  Laterality: Right;  Right Lumbar two-three discectomy    Current Medications: Current Meds  Medication Sig  . enalapril (VASOTEC) 10 MG tablet Take 10 mg by mouth daily.  . metoprolol tartrate (LOPRESSOR) 50 MG tablet Take 50 mg by mouth 2 (two) times daily.   . Multiple Vitamin (MULITIVITAMIN WITH MINERALS) TABS Take 1 tablet by mouth daily.  . nitroGLYCERIN (NITROSTAT) 0.4 MG SL tablet Place 0.4 mg under the tongue every 5 (five) minutes as needed for chest pain.  . rosuvastatin (CRESTOR) 20 MG tablet Take 20 mg by  mouth daily.  . [DISCONTINUED] aspirin 325 MG tablet Take 325 mg by mouth daily.     Allergies:   Atorvastatin and Penicillins   Social History   Socioeconomic History  . Marital status: Married    Spouse name: Not on file  . Number of children: Not on file  . Years of education: Not on file  . Highest education level: Not on file  Occupational History  . Not on file  Social Needs  . Financial resource strain: Not on file  . Food insecurity:    Worry: Not on file    Inability: Not on file  . Transportation needs:    Medical: Not on file    Non-medical: Not on file  Tobacco Use  . Smoking status: Current Every Day Smoker    Packs/day: 0.50    Years: 20.00    Pack years: 10.00    Types: Cigarettes  . Smokeless tobacco: Never Used  Substance and Sexual Activity  . Alcohol use: No  . Drug use: No  . Sexual activity: Not on file  Lifestyle  . Physical activity:    Days per week: Not on file    Minutes per session: Not on file  . Stress: Not on file  Relationships  . Social connections:    Talks on phone: Not on file    Gets together: Not on file    Attends religious service: Not on file    Active member of club or organization: Not on file    Attends meetings of clubs or organizations: Not on file    Relationship status: Not on file  Other Topics Concern  . Not on file  Social History Narrative  . Not on file     Family History: The patient's family history includes Breast cancer in his sister; Coronary artery disease in his mother; Coronary artery disease (age of onset: 4650) in his brother; Coronary artery disease (age of onset: 2153) in his father; Coronary artery disease (age of onset: 4856) in his brother; Diabetes (age of onset: 4640) in his mother; Hypertension in his mother. There is no history of Anesthesia problems. ROS:   Please see the history of present illness.    All other systems reviewed and are negative.  EKGs/Labs/Other Studies Reviewed:    The  following studies were reviewed today:  Recent Lipid Panel   02/07/2018 cholesterol 135 HDL 39 LDL 74 CMP at that time was normal   Physical Exam:    VS:  BP 126/76 (BP Location: Right Arm, Patient Position: Sitting, Cuff Size: Normal)   Pulse 65   Ht 5\' 5"  (1.651 m)   Wt 152 lb 3.2 oz (69 kg)   SpO2 98%   BMI 25.33 kg/m     Wt Readings from Last 3 Encounters:  04/03/18 152 lb 3.2 oz (69 kg)  09/25/11 153 lb 7 oz (69.6 kg)  GEN:  Well nourished, well developed in no acute distress HEENT: Normal NECK: No JVD; No carotid bruits LYMPHATICS: No lymphadenopathy CARDIAC: RRR, no murmurs, rubs, gallops RESPIRATORY:  Clear to auscultation without rales, wheezing or rhonchi  ABDOMEN: Soft, non-tender, non-distended MUSCULOSKELETAL:  No edema; No deformity  SKIN: Warm and dry NEUROLOGIC:  Alert and oriented x 3 PSYCHIATRIC:  Normal affect    Signed, Norman HerrlichBrian Ayano Douthitt, MD  04/03/2018 4:42 PM    [facility]

## 2018-04-03 ENCOUNTER — Encounter: Payer: Self-pay | Admitting: Cardiology

## 2018-04-03 ENCOUNTER — Ambulatory Visit (INDEPENDENT_AMBULATORY_CARE_PROVIDER_SITE_OTHER): Payer: BLUE CROSS/BLUE SHIELD | Admitting: Cardiology

## 2018-04-03 VITALS — BP 126/76 | HR 65 | Ht 65.0 in | Wt 152.2 lb

## 2018-04-03 DIAGNOSIS — E785 Hyperlipidemia, unspecified: Secondary | ICD-10-CM | POA: Diagnosis not present

## 2018-04-03 DIAGNOSIS — I519 Heart disease, unspecified: Secondary | ICD-10-CM

## 2018-04-03 DIAGNOSIS — I209 Angina pectoris, unspecified: Secondary | ICD-10-CM

## 2018-04-03 DIAGNOSIS — I2510 Atherosclerotic heart disease of native coronary artery without angina pectoris: Secondary | ICD-10-CM | POA: Diagnosis not present

## 2018-04-03 HISTORY — DX: Heart disease, unspecified: I51.9

## 2018-04-03 MED ORDER — NITROGLYCERIN 0.4 MG SL SUBL: 0.4000 mg | SUBLINGUAL_TABLET | SUBLINGUAL | 12 refills | Status: DC | PRN

## 2018-04-03 MED ORDER — ASPIRIN EC 81 MG PO TBEC: 81.0000 mg | DELAYED_RELEASE_TABLET | Freq: Every day | ORAL | 3 refills | Status: AC

## 2018-04-03 NOTE — Patient Instructions (Addendum)
Medication Instructions:  Your physician has recommended you make the following change in your medication:  DECREASE aspirin 81 mg daily  Labwork: None  Testing/Procedures: You had an EKG today.   Your physician has requested that you have a stress echocardiogram. For further information please visit [email or web address]. Please follow instruction sheet as given.    Follow-Up: Your physician wants you to follow-up in: 1 year. You will receive a reminder letter in the mail two months in advance. If you don't receive a letter, please call our office to schedule the follow-up appointment.   If you need a refill on your cardiac medications before your next appointment, please call your pharmacy.   Thank you for choosing [facility]! Mady GemmaCatherine Dimitri Dsouza, RN [phone]     Exercise Stress Electrocardiogram An exercise stress electrocardiogram is a test to check how blood flows to your heart. It is done to find areas of poor blood flow. You will need to walk on a treadmill for this test. The electrocardiogram will record your heartbeat when you are at rest and when you are exercising. What happens before the procedure?  Do not have drinks with caffeine or foods with caffeine for 24 hours before the test, or as told by your doctor. This includes coffee, tea (even decaf tea), sodas, chocolate, and cocoa.  Follow your doctor's instructions about eating and drinking before the test.  Ask your doctor what medicines you should or should not take before the test. Take your medicines with water unless told by your doctor not to.  If you use an inhaler, bring it with you to the test.  Bring a snack to eat after the test.  Do not  smoke for 4 hours before the test.  Do not put lotions, powders, creams, or oils on your chest before the test.  Wear comfortable shoes and clothing. What happens during the procedure?  You will have patches put on your chest. Small areas of your chest  may need to be shaved. Wires will be connected to the patches.  Your heart rate will be watched while you are resting and while you are exercising.  You will walk on the treadmill. The treadmill will slowly get faster to raise your heart rate.  The test will take about 1-2 hours. What happens after the procedure?  Your heart rate and blood pressure will be watched after the test.  You may return to your normal diet, activities, and medicines or as told by your doctor. This information is not intended to replace advice given to you by your health care provider. Make sure you discuss any questions you have with your health care provider. Document Released: 01/16/2008 Document Revised: 03/28/2016 Document Reviewed: 04/06/2013 Elsevier Interactive Patient Education  [employer]2018 Elsevier Inc.

## 2018-04-11 ENCOUNTER — Ambulatory Visit (INDEPENDENT_AMBULATORY_CARE_PROVIDER_SITE_OTHER): Payer: BLUE CROSS/BLUE SHIELD

## 2018-04-11 DIAGNOSIS — I2510 Atherosclerotic heart disease of native coronary artery without angina pectoris: Secondary | ICD-10-CM | POA: Diagnosis not present

## 2018-04-11 DIAGNOSIS — I209 Angina pectoris, unspecified: Secondary | ICD-10-CM

## 2018-04-11 NOTE — Progress Notes (Signed)
Echocardiogram stress test with limited exam has been performed.   Jimmy Alveta Quintela RDCS 

## 2018-06-30 ENCOUNTER — Telehealth: Payer: Self-pay | Admitting: *Deleted

## 2018-06-30 MED ORDER — ROSUVASTATIN CALCIUM 20 MG PO TABS: 20.0000 mg | ORAL_TABLET | Freq: Every day | ORAL | 2 refills | Status: DC

## 2018-06-30 NOTE — Telephone Encounter (Signed)
*  STAT* If patient is at the pharmacy, call can be transferred to refill team.   1. Which medications need to be refilled? (please list name of each medication and dose if known) Rosuvastatin 20 mg  2. Which pharmacy/location (including street and city if local pharmacy) is medication to be sent to?[facility] [place]  3. Do they need a 30 day or 90 day supply? 90

## 2019-06-08 ENCOUNTER — Ambulatory Visit: Payer: BLUE CROSS/BLUE SHIELD | Admitting: Cardiology

## 2019-06-25 NOTE — Progress Notes (Signed)
Cardiology Office Note:    Date:  06/26/2019   ID:  Jonathan Fleming, DOB 1961-03-13, MRN [id number]  PCP:  Jonathan Fleming, Jonathan Fleming  Cardiologist:  Jonathan Fleming, Jonathan Fleming    Referring Jonathan Fleming: Jonathan Fleming, Jonathan Fleming    ASSESSMENT:    1. CAD in native artery   2. Hyperlipidemia, unspecified hyperlipidemia type   3. Peripheral vascular disease ([place])    PLAN:    In order of problems listed above:  1. Stable CAD having no anginal discomfort after multiple PCI he is compliant with and will continue treatment including aspirin beta-blocker ACE inhibitor and his high intensity statin.  At this time I do not think he requires an ischemia evaluation 2. Stable lipids are ideal continue his high intensity statin he has had no muscle symptoms of side effect 3. By physical examination I suspect his abdominal aortic aneurysm and has distal aortic occlusive disease.  His symptoms are almost intolerable and I told him I think he should start with a noninvasive evaluation duplex abdominal aorta aortoiliac and lower extremity ABIs segmental pressures and PVR.  He agrees but is worried about cost with this Cobra he needs to pay out-of-pocket and be reimbursed wants to go home and speak with his wife.  Hopefully he will call back to the office and we can schedule these test   Next appointment: 3 months   Medication Adjustments/Labs and Tests Ordered: Current medicines are reviewed at length with the patient today.  Concerns regarding medicines are outlined above.  Orders Placed This Encounter  Procedures  . EKG 12-Lead   No orders of the defined types were placed in this encounter.   Chief Complaint  Patient presents with  . Follow-up  . Coronary Artery Disease    History of Present Illness:    Jonathan Fleming is a 58 y.o. male with a hx of CAD, nonSTEMI in 1997 with PCI  Of LCF,  07/19/16 with PCI and DES to proximal LAD EF 40-45%, PAD and dyslipidemia  last seen 04/03/2018. Compliance with diet,  lifestyle and medications: Yes  Stress echo 04/03/2018: Study Conclusions - Stress ECG conclusions: There were no stress arrhythmias or conduction abnormalities. The stress ECG was negative for ischemia.Baseline EF 60% - Staged echo: There was no echocardiographic evidence for stress-induced ischemia.  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  He also has a history of low back disc disease and surgery.  He asked me to examine him and says that he notices pulsation in his abdomen at times.  He has had no chest pain edema syncope shortness of breath. Past Medical History:  Diagnosis Date  . Angina pectoris ([place]) 07/18/2016   Added automatically from request for surgery [id number]  . Cholelithiasis 07/14/2016  . Coronary artery disease   . Current every day smoker 07/14/2016  . Dyslipidemia 02/21/2016  . Family history of premature coronary artery disease 02/07/2018   Parents, brothers, sisters very young  . H/O heart artery stent 03/04/2017  . Hyperlipidemia   . Hypertension   . Myocardial infarction [facility])    age 40 sees [facility] in [place], Stress test done at [facility], Dr. Rocky Fleming has records  . Old MI (myocardial infarction) 03/04/2017  . Peripheral vascular disease ([place]) 02/21/2016   2007 DX/RX: GEST (+) RLE claudication. s/p angioplasty  Rt. Sup. Femoral artery    Past Surgical History:  Procedure Laterality Date  . APPENDECTOMY    . CORONARY ANGIOPLASTY     Stents x 2 age 76  . LUMBAR LAMINECTOMY/DECOMPRESSION MICRODISCECTOMY  10/01/2011   Procedure: LUMBAR LAMINECTOMY/DECOMPRESSION MICRODISCECTOMY 1 LEVEL;  Surgeon: Jonathan Fleming, Jonathan Fleming;  Location: [facility];  Service: Neurosurgery;  Laterality: Right;  Right Lumbar two-three discectomy    Current Medications: Current Meds  Medication Sig   . aspirin EC 81 MG tablet Take 1 tablet (81 mg total) by mouth daily.  . enalapril (VASOTEC) 10 MG tablet Take 1 tablet by mouth 2 (two) times daily.  . Glucosamine-Chondroit-Vit C-Mn (GLUCOSAMINE 1500 COMPLEX) CAPS Take 1 capsule by mouth daily.  . Multiple Vitamin (MULITIVITAMIN WITH MINERALS) TABS Take 1 tablet by mouth daily.  . nitroGLYCERIN (NITROSTAT) 0.4 MG SL tablet Place 1 tablet (0.4 mg total) under the tongue every 5 (five) minutes as needed for chest pain.  . rosuvastatin (CRESTOR) 20 MG tablet Take 1 tablet (20 mg total) by mouth daily.     Allergies:   Atorvastatin and Penicillins   Social History   Socioeconomic History  . Marital status: Married    Spouse name: Not on file  . Number of children: Not on file  . Years of education: Not on file  . Highest education level: Not on file  Occupational History  . Not on file  Social Needs  . Financial resource strain: Not on file  . Food insecurity    Worry: Not on file    Inability: Not on file  . Transportation needs    Medical: Not on file    Non-medical: Not on file  Tobacco Use  . Smoking status: Former Smoker    Packs/day: 0.50    Years: 20.00    Pack years: 10.00    Types: Cigarettes    Quit date: 06/23/2019  . Smokeless tobacco: Never Used  Substance and Sexual Activity  . Alcohol use: No  . Drug use: No  . Sexual activity: Not on file  Lifestyle  . Physical activity    Days per week: Not on file    Minutes per session: Not on file  . Stress: Not on file  Relationships  . Social Musician on phone: Not on file    Gets together: Not on file    Attends religious service: Not on file    Active member of club or organization: Not on file    Attends meetings of clubs or organizations: Not on file    Relationship status: Not on file  Other Topics Concern  . Not on file  Social History Narrative  . Not on file     Family History: The patient's family history includes Breast cancer in  his sister; Coronary artery disease in his mother; Coronary artery disease (age of onset: 39) in his brother; Coronary artery disease (age of onset: 71) in his father; Coronary artery disease (age of onset: 35) in his brother; Diabetes (age of onset: 79) in his mother; Hypertension in his mother. There is no history of Anesthesia problems. ROS:   Please see the history of present illness.    All other systems reviewed and are negative.  EKGs/Labs/Other Studies Reviewed:    The following studies were reviewed today:  EKG:  EKG ordered today and personally reviewed.  The ekg ordered today demonstrates sinus rhythm old anterior septal MI  Recent Labs: 03/06/2019: Cholesterol 101  triglyceride 99 LDL 44 HDL 49 HDL cholesterol 61 his lipids are ideal CMP normal except for random glucose of 112 GFR 73 cc  Physical Exam:    VS:  BP (!) 160/82 (BP Location: Right Arm, Patient Position: Sitting, Cuff Size: Normal)   Pulse 65   Ht 5\' 6"  (1.676 m)   Wt 147 lb (66.7 kg)   SpO2 99%   BMI 23.73 kg/m     Wt Readings from Last 3 Encounters:  06/26/19 147 lb (66.7 kg)  04/03/18 152 lb 3.2 oz (69 kg)  09/25/11 153 lb 7 oz (69.6 kg)     GEN:  Well nourished, well developed in no acute distress HEENT: Normal NECK: No JVD; No carotid bruits LYMPHATICS: No lymphadenopathy CARDIAC: RRR, no murmurs, rubs, gallops RESPIRATORY:  Clear to auscultation without rales, wheezing or rhonchi  ABDOMEN: Soft, non-tender, non-distended MUSCULOSKELETAL:  No edema; No deformity  SKIN: Warm and dry NEUROLOGIC:  Alert and oriented x 3 PSYCHIATRIC:  Normal affect  His abdominal aorta is pulsatile and I would estimated at 5 cm.  He has a bruit at the distal aorta diminished femoral pulses left greater than right I cannot palpate his foot pulses he has no hair on the lower half of the calf bilaterally over the foot and the skin of the foot is vasoconstricted and normal.  He is not ischemic at rest.  Signed, Norman HerrlichBrian  Munley, Jonathan Fleming  06/26/2019 4:19 PM    [facility]

## 2019-06-26 ENCOUNTER — Encounter: Payer: Self-pay | Admitting: Cardiology

## 2019-06-26 ENCOUNTER — Other Ambulatory Visit: Payer: Self-pay

## 2019-06-26 ENCOUNTER — Ambulatory Visit (INDEPENDENT_AMBULATORY_CARE_PROVIDER_SITE_OTHER): Payer: BC Managed Care – PPO | Admitting: Cardiology

## 2019-06-26 VITALS — BP 160/82 | HR 65 | Ht 66.0 in | Wt 147.0 lb

## 2019-06-26 DIAGNOSIS — I2510 Atherosclerotic heart disease of native coronary artery without angina pectoris: Secondary | ICD-10-CM

## 2019-06-26 DIAGNOSIS — I739 Peripheral vascular disease, unspecified: Secondary | ICD-10-CM | POA: Diagnosis not present

## 2019-06-26 DIAGNOSIS — E785 Hyperlipidemia, unspecified: Secondary | ICD-10-CM

## 2019-06-26 NOTE — Addendum Note (Signed)
Addended by: Austin Miles on: 06/26/2019 04:44 PM   Modules accepted: Orders

## 2019-06-26 NOTE — Patient Instructions (Addendum)
Medication Instructions:  Your physician recommends that you continue on your current medications as directed. Please refer to the Current Medication list given to you today.  *If you need a refill on your cardiac medications before your next appointment, please call your pharmacy*  Lab Work: None  If you have labs (blood work) drawn today and your tests are completely normal, you will receive your results only by: Marland Kitchen MyChart Message (if you have MyChart) OR . A paper copy in the mail If you have any lab test that is abnormal or we need to change your treatment, we will call you to review the results.  Testing/Procedures: You had an EKG today.   Follow-Up: At [facility], you and your health needs are our priority.  As part of our continuing mission to provide you with exceptional heart care, we have created designated Provider Care Teams.  These Care Teams include your primary Cardiologist (physician) and Advanced Practice Providers (APPs -  Physician Assistants and Nurse Practitioners) who all work together to provide you with the care you need, when you need it.  Your next appointment:   3 months  The format for your next appointment:   In Person  Provider:   Shirlee More, MD  Other Instructions **Dr. Bettina Gavia recommends the following testing: Ultrasound of the abdominal aorta and iliac veins and ABI (ankle brachial index) of your lower extremities. Please discuss with your wife and contact our office to schedule afterwards if you would like to proceed with testing.

## 2019-09-28 ENCOUNTER — Ambulatory Visit: Payer: BC Managed Care – PPO | Admitting: Cardiology

## 2019-09-29 ENCOUNTER — Ambulatory Visit: Payer: BC Managed Care – PPO | Admitting: Cardiology

## 2019-10-01 ENCOUNTER — Other Ambulatory Visit: Payer: Self-pay

## 2019-10-01 ENCOUNTER — Telehealth: Payer: BC Managed Care – PPO | Admitting: Cardiology

## 2019-10-01 NOTE — Progress Notes (Deleted)
Cardiology Office Note:    Date:  10/01/2019   ID:  Jonathan Fleming, DOB February 28, 1961, MRN [id number]  PCP:  Olive Bass, MD  Cardiologist:  Norman Herrlich, MD    Referring MD: Olive Bass, MD    ASSESSMENT:    No diagnosis found. PLAN:    In order of problems listed above:  1. ***   Next appointment: ***   Medication Adjustments/Labs and Tests Ordered: Current medicines are reviewed at length with the patient today.  Concerns regarding medicines are outlined above.  No orders of the defined types were placed in this encounter.  No orders of the defined types were placed in this encounter.   No chief complaint on file.   History of Present Illness:    Jonathan Fleming is a 59 y.o. male with a hx of CAD, nonSTEMI in 1997 with PCI  Of LCF,  07/19/16 with PCI and DES to proximal LAD EF 40-45%, PAD and dyslipidemia  last seen 08/25/2018.  The time of his last visit he was having limiting claudication or pseudoclaudication with a history of disc disease surgery and low back pain.  Further evaluation vascular duplex was ordered aorta iliacs and lower extremity ABI with segmental pressures.  Test was not performed. Compliance with diet, lifestyle and medications: *** Past Medical History:  Diagnosis Date  . Angina pectoris (HCC) 07/18/2016   Added automatically from request for surgery [id number]  . Cholelithiasis 07/14/2016  . Coronary artery disease   . Current every day smoker 07/14/2016  . Dyslipidemia 02/21/2016  . Family history of premature coronary artery disease 02/07/2018   Parents, brothers, sisters very young  . H/O heart artery stent 03/04/2017  . Hyperlipidemia   . Hypertension   . Myocardial infarction [facility])    age 54 sees [facility] in [place], Stress test done at [facility], Dr. Benedetto Goad has records  . Old MI (myocardial infarction) 03/04/2017  . Peripheral vascular disease (HCC) 02/21/2016   2007 DX/RX: GEST (+) RLE claudication. s/p  angioplasty Rt. Sup. Femoral artery    Past Surgical History:  Procedure Laterality Date  . APPENDECTOMY    . CORONARY ANGIOPLASTY     Stents x 2 age 12  . LUMBAR LAMINECTOMY/DECOMPRESSION MICRODISCECTOMY  10/01/2011   Procedure: LUMBAR LAMINECTOMY/DECOMPRESSION MICRODISCECTOMY 1 LEVEL;  Surgeon: Cristi Loron, MD;  Location: [facility];  Service: Neurosurgery;  Laterality: Right;  Right Lumbar two-three discectomy    Current Medications: No outpatient medications have been marked as taking for the 10/01/19 encounter (Appointment) with Baldo Daub, MD.     Allergies:   Atorvastatin and Penicillins   Social History   Socioeconomic History  . Marital status: Married    Spouse name: Not on file  . Number of children: Not on file  . Years of education: Not on file  . Highest education level: Not on file  Occupational History  . Not on file  Tobacco Use  . Smoking status: Former Smoker    Packs/day: 0.50    Years: 20.00    Pack years: 10.00    Types: Cigarettes    Quit date: 06/23/2019    Years since quitting: 0.2  . Smokeless tobacco: Never Used  Substance and Sexual Activity  . Alcohol use: No  . Drug use: No  . Sexual activity: Not on file  Other Topics Concern  . Not on file  Social History Narrative  . Not on file   Social Determinants of Health  Financial Resource Strain:   . Difficulty of Paying Living Expenses: Not on file  Food Insecurity:   . Worried About Charity fundraiser in the Last Year: Not on file  . Ran Out of Food in the Last Year: Not on file  Transportation Needs:   . Lack of Transportation (Medical): Not on file  . Lack of Transportation (Non-Medical): Not on file  Physical Activity:   . Days of Exercise per Week: Not on file  . Minutes of Exercise per Session: Not on file  Stress:   . Feeling of Stress : Not on file  Social Connections:   . Frequency of Communication with Friends and Family: Not on file  . Frequency of Social  Gatherings with Friends and Family: Not on file  . Attends Religious Services: Not on file  . Active Member of Clubs or Organizations: Not on file  . Attends Archivist Meetings: Not on file  . Marital Status: Not on file     Family History: The patient's ***family history includes Breast cancer in his sister; Coronary artery disease in his mother; Coronary artery disease (age of onset: 77) in his brother; Coronary artery disease (age of onset: 27) in his father; Coronary artery disease (age of onset: 65) in his brother; Diabetes (age of onset: 37) in his mother; Hypertension in his mother. There is no history of Anesthesia problems. ROS:   Please see the history of present illness.    All other systems reviewed and are negative.  EKGs/Labs/Other Studies Reviewed:    The following studies were reviewed today:  EKG:  EKG ordered today and personally reviewed.  The ekg ordered today demonstrates ***  Recent Labs: Noted care everywhere from [facility] 09/11/2019: Cholesterol 119 triglyceride 104 HDL 41 LDL at target 63 non-HDL cholesterol 78 at target CMP showed normal renal function creatinine 0.99 GFR 84 cc potassium 4.4 and normal liver function test. Hemoglobin A1c at target 5.9%  Physical Exam:    VS:  There were no vitals taken for this visit.    Wt Readings from Last 3 Encounters:  06/26/19 147 lb (66.7 kg)  04/03/18 152 lb 3.2 oz (69 kg)  09/25/11 153 lb 7 oz (69.6 kg)     GEN: *** Well nourished, well developed in no acute distress HEENT: Normal NECK: No JVD; No carotid bruits LYMPHATICS: No lymphadenopathy CARDIAC: ***RRR, no murmurs, rubs, gallops RESPIRATORY:  Clear to auscultation without rales, wheezing or rhonchi  ABDOMEN: Soft, non-tender, non-distended MUSCULOSKELETAL:  No edema; No deformity  SKIN: Warm and dry NEUROLOGIC:  Alert and oriented x 3 PSYCHIATRIC:  Normal affect    Signed, Shirlee More, MD  10/01/2019 11:14 AM    [place]

## 2019-11-11 ENCOUNTER — Other Ambulatory Visit: Payer: Self-pay

## 2019-11-12 ENCOUNTER — Ambulatory Visit (INDEPENDENT_AMBULATORY_CARE_PROVIDER_SITE_OTHER): Payer: BC Managed Care – PPO | Admitting: Cardiology

## 2019-11-12 ENCOUNTER — Encounter: Payer: Self-pay | Admitting: Cardiology

## 2019-11-12 ENCOUNTER — Other Ambulatory Visit: Payer: Self-pay

## 2019-11-12 VITALS — BP 142/78 | HR 60 | Ht 66.0 in | Wt 143.4 lb

## 2019-11-12 DIAGNOSIS — E785 Hyperlipidemia, unspecified: Secondary | ICD-10-CM | POA: Diagnosis not present

## 2019-11-12 DIAGNOSIS — I2510 Atherosclerotic heart disease of native coronary artery without angina pectoris: Secondary | ICD-10-CM

## 2019-11-12 NOTE — Addendum Note (Signed)
Addended by: Delorse Limber I on: 11/12/2019 08:47 AM   Modules accepted: Orders

## 2019-11-12 NOTE — Progress Notes (Signed)
Cardiology Office Note:    Date:  11/12/2019   ID:  Jonathan Fleming, DOB 01/13/1961, MRN [id number]  PCP:  Olive Bass, MD  Cardiologist:  Norman Herrlich, MD    Referring MD: Olive Bass, MD    ASSESSMENT:    1. CAD in native artery   2. Hyperlipidemia, unspecified hyperlipidemia type    PLAN:    In order of problems listed above:  1. Stable CAD continue medical therapy aspirin beta-blocker high intensity statin this time does not require an ischemic evaluation 2. Lipids are ideal continue with statin 3. Claudication vascular studies to be rescheduled   Next appointment: 6 months   Medication Adjustments/Labs and Tests Ordered: Current medicines are reviewed at length with the patient today.  Concerns regarding medicines are outlined above.  No orders of the defined types were placed in this encounter.  No orders of the defined types were placed in this encounter.   Chief Complaint  Patient presents with  . Follow-up  . Coronary Artery Disease    History of Present Illness:    Jonathan Fleming is a 59 y.o. male with a hx of CAD, nonSTEMI in 1997 with PCI  Of LCF,  07/19/16 with PCI and DES to proximal LAD EF 40-45%, PAD and dyslipidemia  last seen 06/26/2019.  Stress echo 04/03/2018 showed no evidence of ischemia and his ejection fraction was normal at 60%. Compliance with diet, lifestyle and medications: Yes  Recent labs primary care physician 09/11/2019 CMP normal except for glucose 129 normal normal liver and renal function total cholesterol at target 119 LDL 63 HDL 41 triglycerides 100:  Most of had vascular studies in November he tells me that the appointment was canceled due to weather and never rescheduled.  He remains vigorous and active and has had no angina shortness of breath palpitation or syncope. Past Medical History:  Diagnosis Date  . Angina pectoris (HCC) 07/18/2016   Added automatically from request for surgery [id number]  . CAD in native  artery 02/21/2016   1997 DX/RX: Age 18. NSTEMI Inferior, Stent Circ x 2. MID LAD 70-80%  2009 Cardiolite Neg Coronary angiography 07/19/16: Conclusions Diagnostic Summary Culprit lesion is prox LAD--95%+ with TIMI II flow. Cx stent 60% in stent stenosis. Mid RCA has 65% lesion, R-L collaterals to LAD. LV gram shows moderate antero=apical hypokinesis--EF 40-45% Diagnostic Recommendations PCI recommended Interventional  . Cholelithiasis 07/14/2016  . Coronary artery disease   . Current every day smoker 07/14/2016  . Dyslipidemia 02/21/2016  . Family history of premature coronary artery disease 02/07/2018   Parents, brothers, sisters very young  . H/O heart artery stent 03/04/2017  . Hyperlipidemia   . Hypertension   . LV dysfunction 04/03/2018  . Myocardial infarction [facility])    age 59 sees [facility] in [place], Stress test done at [facility], Dr. Benedetto Goad has records  . Old MI (myocardial infarction) 03/04/2017  . Peripheral vascular disease (HCC) 02/21/2016   2007 DX/RX: GEST (+) RLE claudication. s/p angioplasty Rt. Sup. Femoral artery  . Prediabetes 02/21/2016   2018: 127/5.5  Formatting of this note might be different from the original. 2018: 127/5.5    Past Surgical History:  Procedure Laterality Date  . APPENDECTOMY    . CORONARY ANGIOPLASTY     Stents x 2 age 58  . LUMBAR LAMINECTOMY/DECOMPRESSION MICRODISCECTOMY  10/01/2011   Procedure: LUMBAR LAMINECTOMY/DECOMPRESSION MICRODISCECTOMY 1 LEVEL;  Surgeon: Cristi Loron, MD;  Location: [facility];  Service: Neurosurgery;  Laterality: Right;  Right Lumbar two-three discectomy    Current Medications: Current Meds  Medication Sig  . aspirin EC 81 MG tablet Take 1 tablet (81 mg total) by mouth daily.  . enalapril (VASOTEC) 10 MG tablet Take 1 tablet by mouth 2 (two) times daily.  . Glucosamine-Chondroit-Vit C-Mn (GLUCOSAMINE 1500 COMPLEX) CAPS Take 1 capsule by mouth daily.  . metoprolol tartrate (LOPRESSOR) 100 MG  tablet Take 50 mg by mouth 2 (two) times daily.  . Multiple Vitamin (MULITIVITAMIN WITH MINERALS) TABS Take 1 tablet by mouth daily.  . nitroGLYCERIN (NITROSTAT) 0.4 MG SL tablet Place 1 tablet (0.4 mg total) under the tongue every 5 (five) minutes as needed for chest pain.  . rosuvastatin (CRESTOR) 40 MG tablet Take 40 mg by mouth daily.     Allergies:   Atorvastatin and Penicillins   Social History   Socioeconomic History  . Marital status: Married    Spouse name: Not on file  . Number of children: Not on file  . Years of education: Not on file  . Highest education level: Not on file  Occupational History  . Not on file  Tobacco Use  . Smoking status: Former Smoker    Packs/day: 0.50    Years: 20.00    Pack years: 10.00    Types: Cigarettes    Quit date: 06/23/2019    Years since quitting: 0.3  . Smokeless tobacco: Never Used  Substance and Sexual Activity  . Alcohol use: No  . Drug use: No  . Sexual activity: Not on file  Other Topics Concern  . Not on file  Social History Narrative  . Not on file   Social Determinants of Health   Financial Resource Strain:   . Difficulty of Paying Living Expenses:   Food Insecurity:   . Worried About Charity fundraiser in the Last Year:   . Arboriculturist in the Last Year:   Transportation Needs:   . Film/video editor (Medical):   Marland Kitchen Lack of Transportation (Non-Medical):   Physical Activity:   . Days of Exercise per Week:   . Minutes of Exercise per Session:   Stress:   . Feeling of Stress :   Social Connections:   . Frequency of Communication with Friends and Family:   . Frequency of Social Gatherings with Friends and Family:   . Attends Religious Services:   . Active Member of Clubs or Organizations:   . Attends Archivist Meetings:   Marland Kitchen Marital Status:      Family History: The patient's family history includes Breast cancer in his sister; Coronary artery disease in his mother; Coronary artery disease  (age of onset: 84) in his brother; Coronary artery disease (age of onset: 63) in his father; Coronary artery disease (age of onset: 34) in his brother; Diabetes (age of onset: 62) in his mother; Hypertension in his mother. There is no history of Anesthesia problems. ROS:   Please see the history of present illness.    All other systems reviewed and are negative.  EKGs/Labs/Other Studies Reviewed:    The following studies were reviewed today:     Physical Exam:    VS:  BP (!) 142/78   Pulse 60   Ht 5\' 6"  (1.676 m)   Wt 143 lb 6.4 oz (65 kg)   SpO2 99%   BMI 23.15 kg/m     Wt Readings from Last 3 Encounters:  11/12/19 143 lb 6.4 oz (65  kg)  06/26/19 147 lb (66.7 kg)  04/03/18 152 lb 3.2 oz (69 kg)     GEN:  Well nourished, well developed in no acute distress HEENT: Normal NECK: No JVD; No carotid bruits LYMPHATICS: No lymphadenopathy CARDIAC: RRR, no murmurs, rubs, gallops RESPIRATORY:  Clear to auscultation without rales, wheezing or rhonchi  ABDOMEN: Soft, non-tender, non-distended MUSCULOSKELETAL:  No edema; No deformity  SKIN: Warm and dry NEUROLOGIC:  Alert and oriented x 3 PSYCHIATRIC:  Normal affect    Signed, Norman Herrlich, MD  11/12/2019 8:29 AM    [facility]

## 2019-11-12 NOTE — Patient Instructions (Signed)
Medication Instructions:  Your physician recommends that you continue on your current medications as directed. Please refer to the Current Medication list given to you today.  *If you need a refill on your cardiac medications before your next appointment, please call your pharmacy*   Lab Work: None If you have labs (blood work) drawn today and your tests are completely normal, you will receive your results only by: Marland Kitchen MyChart Message (if you have MyChart) OR . A paper copy in the mail If you have any lab test that is abnormal or we need to change your treatment, we will call you to review the results.   Testing/Procedures: Your physician has requested that you have an ankle brachial index (ABI). During this test an ultrasound and blood pressure cuff are used to evaluate the arteries that supply the arms and legs with blood. Allow thirty minutes for this exam. There are no restrictions or special instructions.  Your physician has requested that you have a vascular ultrasound of your aorta/IVC/Iliacs.    Follow-Up: At [facility], you and your health needs are our priority.  As part of our continuing mission to provide you with exceptional heart care, we have created designated Provider Care Teams.  These Care Teams include your primary Cardiologist (physician) and Advanced Practice Providers (APPs -  Physician Assistants and Nurse Practitioners) who all work together to provide you with the care you need, when you need it.  We recommend signing up for the patient portal called "MyChart".  Sign up information is provided on this After Visit Summary.  MyChart is used to connect with patients for Virtual Visits (Telemedicine).  Patients are able to view lab/test results, encounter notes, upcoming appointments, etc.  Non-urgent messages can be sent to your provider as well.   To learn more about what you can do with MyChart, go to [email or web address].    Your next appointment:   6  month(s)  The format for your next appointment:   In Person  Provider:   Norman Herrlich, MD   Other Instructions  Use Claritin over the counter for allergies.

## 2019-12-16 ENCOUNTER — Ambulatory Visit (INDEPENDENT_AMBULATORY_CARE_PROVIDER_SITE_OTHER): Payer: BC Managed Care – PPO

## 2019-12-16 ENCOUNTER — Telehealth: Payer: Self-pay

## 2019-12-16 ENCOUNTER — Other Ambulatory Visit: Payer: Self-pay

## 2019-12-16 DIAGNOSIS — I2510 Atherosclerotic heart disease of native coronary artery without angina pectoris: Secondary | ICD-10-CM | POA: Diagnosis not present

## 2019-12-16 DIAGNOSIS — I739 Peripheral vascular disease, unspecified: Secondary | ICD-10-CM

## 2019-12-16 NOTE — Telephone Encounter (Signed)
Left message on patients voicemail to please return our call.   

## 2019-12-16 NOTE — Telephone Encounter (Signed)
-----   Message from Baldo Daub, MD sent at 12/16/2019 12:53 PM EDT ----- Normal or stable result  Abnormal advise to see vascular surgery Dr Marolyn Haller

## 2019-12-16 NOTE — Progress Notes (Signed)
ABI has been performed.  Jimmy Nazair Fortenberry RDCS, RVT 

## 2019-12-16 NOTE — Progress Notes (Addendum)
Abdominal aortic/ iliac duplex exam performed.  Jimmy Cherry Wittwer RDCS, RVT

## 2019-12-17 NOTE — Telephone Encounter (Signed)
Patient returned call. Call successfully transferred to [facility].

## 2019-12-17 NOTE — Telephone Encounter (Signed)
Left message on patients voicemail to please return our call.   

## 2019-12-17 NOTE — Telephone Encounter (Signed)
Spoke with patient regarding results and recommendation.  Patient verbalizes understanding and is agreeable to plan of care. Advised patient to call back with any issues or concerns.  

## 2019-12-23 ENCOUNTER — Telehealth: Payer: Self-pay | Admitting: Cardiology

## 2019-12-23 NOTE — Telephone Encounter (Signed)
Patient calling for results of his vascular test. Call dropped in the middle of looking through chart.

## 2019-12-24 NOTE — Telephone Encounter (Signed)
Left a detailed message with the patient letting him know that the referral for vascular surgery with Dr. Darrick Penna was placed and they should call him to schedule this appointment. I also gave him their phone number just in case he did not get a call.    Encouraged patient to call back with any questions or concerns.

## 2019-12-24 NOTE — Telephone Encounter (Signed)
Wife of the patient called back. He got the test results but was told another procedure would be needed. He had not heard anything about setting up another procedure and just wanted to follow up with Dr. Dulce Sellar.

## 2020-01-14 ENCOUNTER — Other Ambulatory Visit: Payer: Self-pay

## 2020-01-14 ENCOUNTER — Ambulatory Visit (INDEPENDENT_AMBULATORY_CARE_PROVIDER_SITE_OTHER): Payer: BC Managed Care – PPO | Admitting: Vascular Surgery

## 2020-01-14 ENCOUNTER — Encounter: Payer: Self-pay | Admitting: Vascular Surgery

## 2020-01-14 VITALS — BP 166/89 | HR 72 | Temp 97.6°F | Resp 20 | Ht 66.0 in | Wt 141.0 lb

## 2020-01-14 DIAGNOSIS — I739 Peripheral vascular disease, unspecified: Secondary | ICD-10-CM | POA: Diagnosis not present

## 2020-01-14 NOTE — H&P (View-Only) (Signed)
Referring Physician: Dr Dulce Sellar  Patient name: Jonathan Fleming MRN: [id number] DOB: 07-Jun-1961 Sex: male  REASON FOR CONSULT: Claudication  HPI: Jonathan Fleming is a 59 y.o. male, with a 1 year history of progressive slowly worsening claudication both lower extremities.  The left leg is worse than the right.  He is now starting to develop some numbness and tingling in the left forefoot.  He also has some occasional pain in the left foot while he is sleeping that wakes him.  He is able to walk about a half a block before experiencing symptoms in both legs.  Left leg is slightly worse than the right.  He is on aspirin and statin.  Other medical problems include coronary artery disease, hyperlipidemia, hypertension all of which have been stable.  He does have a family history of premature vascular disease.  He is still smoking about a half a pack of cigarettes per day..  Past Medical History:  Diagnosis Date  . Angina pectoris (HCC) 07/18/2016   Added automatically from request for surgery [id number]  . CAD in native artery 02/21/2016   1997 DX/RX: Age 36. NSTEMI Inferior, Stent Circ x 2. MID LAD 70-80%  2009 Cardiolite Neg Coronary angiography 07/19/16: Conclusions Diagnostic Summary Culprit lesion is prox LAD--95%+ with TIMI II flow. Cx stent 60% in stent stenosis. Mid RCA has 65% lesion, R-L collaterals to LAD. LV gram shows moderate antero=apical hypokinesis--EF 40-45% Diagnostic Recommendations PCI recommended Interventional  . Cholelithiasis 07/14/2016  . Coronary artery disease   . Current every day smoker 07/14/2016  . Dyslipidemia 02/21/2016  . Family history of premature coronary artery disease 02/07/2018   Parents, brothers, sisters very young  . H/O heart artery stent 03/04/2017  . Hyperlipidemia   . Hypertension   . LV dysfunction 04/03/2018  . Myocardial infarction [facility])    age 46 sees [facility] in [place], Stress test done at [facility], Dr. Benedetto Goad has records  .  Old MI (myocardial infarction) 03/04/2017  . Peripheral vascular disease (HCC) 02/21/2016   2007 DX/RX: GEST (+) RLE claudication. s/p angioplasty Rt. Sup. Femoral artery  . Prediabetes 02/21/2016   2018: 127/5.5  Formatting of this note might be different from the original. 2018: 127/5.5   Past Surgical History:  Procedure Laterality Date  . APPENDECTOMY    . CORONARY ANGIOPLASTY     Stents x 2 age 42  . LUMBAR LAMINECTOMY/DECOMPRESSION MICRODISCECTOMY  10/01/2011   Procedure: LUMBAR LAMINECTOMY/DECOMPRESSION MICRODISCECTOMY 1 LEVEL;  Surgeon: Cristi Loron, MD;  Location: [facility];  Service: Neurosurgery;  Laterality: Right;  Right Lumbar two-three discectomy    Family History  Problem Relation Age of Onset  . Diabetes Mother 39  . Coronary artery disease Mother   . Hypertension Mother   . Coronary artery disease Father 50  . Breast cancer Sister   . Coronary artery disease Brother 21  . Coronary artery disease Brother 91  . Anesthesia problems Neg Hx     SOCIAL HISTORY: Social History   Socioeconomic History  . Marital status: Married    Spouse name: Not on file  . Number of children: Not on file  . Years of education: Not on file  . Highest education level: Not on file  Occupational History  . Not on file  Tobacco Use  . Smoking status: Former Smoker    Packs/day: 0.50    Years: 20.00    Pack years: 10.00    Types: Cigarettes  Quit date: 06/23/2019    Years since quitting: 0.5  . Smokeless tobacco: Never Used  Substance and Sexual Activity  . Alcohol use: No  . Drug use: No  . Sexual activity: Not on file  Other Topics Concern  . Not on file  Social History Narrative  . Not on file   Social Determinants of Health   Financial Resource Strain:   . Difficulty of Paying Living Expenses:   Food Insecurity:   . Worried About Programme researcher, broadcasting/film/video in the Last Year:   . Barista in the Last Year:   Transportation Needs:   . Freight forwarder  (Medical):   Marland Kitchen Lack of Transportation (Non-Medical):   Physical Activity:   . Days of Exercise per Week:   . Minutes of Exercise per Session:   Stress:   . Feeling of Stress :   Social Connections:   . Frequency of Communication with Friends and Family:   . Frequency of Social Gatherings with Friends and Family:   . Attends Religious Services:   . Active Member of Clubs or Organizations:   . Attends Banker Meetings:   Marland Kitchen Marital Status:   Intimate Partner Violence:   . Fear of Current or Ex-Partner:   . Emotionally Abused:   Marland Kitchen Physically Abused:   . Sexually Abused:     Allergies  Allergen Reactions  . Atorvastatin Other (See Comments)    Increased LFT Weakness per patient   . Penicillins Nausea And Vomiting    Current Outpatient Medications  Medication Sig Dispense Refill  . aspirin EC 81 MG tablet Take 1 tablet (81 mg total) by mouth daily. 90 tablet 3  . enalapril (VASOTEC) 10 MG tablet Take 1 tablet by mouth 2 (two) times daily.    . Glucosamine-Chondroit-Vit C-Mn (GLUCOSAMINE 1500 COMPLEX) CAPS Take 1 capsule by mouth daily.    . metoprolol tartrate (LOPRESSOR) 100 MG tablet Take 50 mg by mouth 2 (two) times daily.    . Multiple Vitamin (MULITIVITAMIN WITH MINERALS) TABS Take 1 tablet by mouth daily.    . nitroGLYCERIN (NITROSTAT) 0.4 MG SL tablet Place 1 tablet (0.4 mg total) under the tongue every 5 (five) minutes as needed for chest pain. 25 tablet 12  . rosuvastatin (CRESTOR) 40 MG tablet Take 40 mg by mouth daily.     No current facility-administered medications for this visit.    ROS:   General:  No weight loss, Fever, chills  HEENT: No recent headaches, no nasal bleeding, no visual changes, no sore throat  Neurologic: No dizziness, blackouts, seizures. No recent symptoms of stroke or mini- stroke. No recent episodes of slurred speech, or temporary blindness.  Cardiac: No recent episodes of chest pain/pressure, no shortness of breath at rest.   No shortness of breath with exertion.  Denies history of atrial fibrillation or irregular heartbeat  Vascular: No history of rest pain in feet.  No history of claudication.  No history of non-healing ulcer, No history of DVT   Pulmonary: No home oxygen, no productive cough, no hemoptysis,  No asthma or wheezing  Musculoskeletal:  [ ]  Arthritis, [ ]  Low back pain,  [ ]  Joint pain  Hematologic:No history of hypercoagulable state.  No history of easy bleeding.  No history of anemia  Gastrointestinal: No hematochezia or melena,  No gastroesophageal reflux, no trouble swallowing  Urinary: [ ]  chronic Kidney disease, [ ]  on HD - [ ]  MWF or [ ]  TTHS, [ ]   Burning with urination, [ ]  Frequent urination, [ ]  Difficulty urinating;   Skin: No rashes  Psychological: No history of anxiety,  No history of depression   Physical Examination  Vitals:   01/14/20 1421  BP: (!) 166/89  Pulse: 72  Resp: 20  Temp: 97.6 F (36.4 C)  SpO2: 95%  Weight: 141 lb (64 kg)  Height: 5\' 6"  (1.676 m)    Body mass index is 22.76 kg/m.  General:  Alert and oriented, no acute distress HEENT: Normal Neck: No JVD Cardiac: Regular Rate and Rhythm Abdomen: Soft, non-tender, non-distended, no mass Skin: No rash Extremity Pulses:  2+ radial, brachial, femoral, absent popliteal dorsalis pedis, posterior tibial pulses bilaterally Musculoskeletal: No deformity or edema  Neurologic: Upper and lower extremity motor 5/5 and symmetric  DATA:  Patient had bilateral ABIs performed on the fifth 2021.  I reviewed and interpreted these today.  Right side 0.75 left side 0.71.  Aortoiliac duplex was also done the same day which showed no significant inflow occlusive disease.  There was no evidence of aneurysm.  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  Patient again was encouraged to quit  smoking.  I discussed with him limited durability of any intervention if he continues to smoke as well as long-term health effects.  2.  Aortogram lower extremity runoff possible intervention scheduled for February 05, 2020.  Risk benefits possible complications of procedure details were discussed with patient today including not limited to bleeding infection vessel injury contrast reaction.  He understands and agrees to proceed.   Ruta Hinds, MD [facility] Office: [phone]

## 2020-01-14 NOTE — Progress Notes (Signed)
  Referring Physician: Dr Munley  Patient name: Jonathan Fleming MRN: [id number] DOB: 05/04/1961 Sex: male  REASON FOR CONSULT: Claudication  HPI: Jonathan Fleming is a 59 y.o. male, with a 1 year history of progressive slowly worsening claudication both lower extremities.  The left leg is worse than the right.  He is now starting to develop some numbness and tingling in the left forefoot.  He also has some occasional pain in the left foot while he is sleeping that wakes him.  He is able to walk about a half a block before experiencing symptoms in both legs.  Left leg is slightly worse than the right.  He is on aspirin and statin.  Other medical problems include coronary artery disease, hyperlipidemia, hypertension all of which have been stable.  He does have a family history of premature vascular disease.  He is still smoking about a half a pack of cigarettes per day..  Past Medical History:  Diagnosis Date  . Angina pectoris (HCC) 07/18/2016   Added automatically from request for surgery [id number]  . CAD in native artery 02/21/2016   1997 DX/RX: Age 35. NSTEMI Inferior, Stent Circ x 2. MID LAD 70-80%  2009 Cardiolite Neg Coronary angiography 07/19/16: Conclusions Diagnostic Summary Culprit lesion is prox LAD--95%+ with TIMI II flow. Cx stent 60% in stent stenosis. Mid RCA has 65% lesion, R-L collaterals to LAD. LV gram shows moderate antero=apical hypokinesis--EF 40-45% Diagnostic Recommendations PCI recommended Interventional  . Cholelithiasis 07/14/2016  . Coronary artery disease   . Current every day smoker 07/14/2016  . Dyslipidemia 02/21/2016  . Family history of premature coronary artery disease 02/07/2018   Parents, brothers, sisters very young  . H/O heart artery stent 03/04/2017  . Hyperlipidemia   . Hypertension   . LV dysfunction 04/03/2018  . Myocardial infarction (HCC)    age 35 sees [facility] in [place], Stress test done at [facility], Dr. Shevlin has records  .  Old MI (myocardial infarction) 03/04/2017  . Peripheral vascular disease (HCC) 02/21/2016   2007 DX/RX: GEST (+) RLE claudication. s/p angioplasty Rt. Sup. Femoral artery  . Prediabetes 02/21/2016   2018: 127/5.5  Formatting of this note might be different from the original. 2018: 127/5.5   Past Surgical History:  Procedure Laterality Date  . APPENDECTOMY    . CORONARY ANGIOPLASTY     Stents x 2 age 35  . LUMBAR LAMINECTOMY/DECOMPRESSION MICRODISCECTOMY  10/01/2011   Procedure: LUMBAR LAMINECTOMY/DECOMPRESSION MICRODISCECTOMY 1 LEVEL;  Surgeon: Jeffrey D Jenkins, MD;  Location: [facility];  Service: Neurosurgery;  Laterality: Right;  Right Lumbar two-three discectomy    Family History  Problem Relation Age of Onset  . Diabetes Mother 40  . Coronary artery disease Mother   . Hypertension Mother   . Coronary artery disease Father 53  . Breast cancer Sister   . Coronary artery disease Brother 56  . Coronary artery disease Brother 50  . Anesthesia problems Neg Hx     SOCIAL HISTORY: Social History   Socioeconomic History  . Marital status: Married    Spouse name: Not on file  . Number of children: Not on file  . Years of education: Not on file  . Highest education level: Not on file  Occupational History  . Not on file  Tobacco Use  . Smoking status: Former Smoker    Packs/day: 0.50    Years: 20.00    Pack years: 10.00    Types: Cigarettes      Quit date: 06/23/2019    Years since quitting: 0.5  . Smokeless tobacco: Never Used  Substance and Sexual Activity  . Alcohol use: No  . Drug use: No  . Sexual activity: Not on file  Other Topics Concern  . Not on file  Social History Narrative  . Not on file   Social Determinants of Health   Financial Resource Strain:   . Difficulty of Paying Living Expenses:   Food Insecurity:   . Worried About Programme researcher, broadcasting/film/video in the Last Year:   . Barista in the Last Year:   Transportation Needs:   . Freight forwarder  (Medical):   Marland Kitchen Lack of Transportation (Non-Medical):   Physical Activity:   . Days of Exercise per Week:   . Minutes of Exercise per Session:   Stress:   . Feeling of Stress :   Social Connections:   . Frequency of Communication with Friends and Family:   . Frequency of Social Gatherings with Friends and Family:   . Attends Religious Services:   . Active Member of Clubs or Organizations:   . Attends Banker Meetings:   Marland Kitchen Marital Status:   Intimate Partner Violence:   . Fear of Current or Ex-Partner:   . Emotionally Abused:   Marland Kitchen Physically Abused:   . Sexually Abused:     Allergies  Allergen Reactions  . Atorvastatin Other (See Comments)    Increased LFT Weakness per patient   . Penicillins Nausea And Vomiting    Current Outpatient Medications  Medication Sig Dispense Refill  . aspirin EC 81 MG tablet Take 1 tablet (81 mg total) by mouth daily. 90 tablet 3  . enalapril (VASOTEC) 10 MG tablet Take 1 tablet by mouth 2 (two) times daily.    . Glucosamine-Chondroit-Vit C-Mn (GLUCOSAMINE 1500 COMPLEX) CAPS Take 1 capsule by mouth daily.    . metoprolol tartrate (LOPRESSOR) 100 MG tablet Take 50 mg by mouth 2 (two) times daily.    . Multiple Vitamin (MULITIVITAMIN WITH MINERALS) TABS Take 1 tablet by mouth daily.    . nitroGLYCERIN (NITROSTAT) 0.4 MG SL tablet Place 1 tablet (0.4 mg total) under the tongue every 5 (five) minutes as needed for chest pain. 25 tablet 12  . rosuvastatin (CRESTOR) 40 MG tablet Take 40 mg by mouth daily.     No current facility-administered medications for this visit.    ROS:   General:  No weight loss, Fever, chills  HEENT: No recent headaches, no nasal bleeding, no visual changes, no sore throat  Neurologic: No dizziness, blackouts, seizures. No recent symptoms of stroke or mini- stroke. No recent episodes of slurred speech, or temporary blindness.  Cardiac: No recent episodes of chest pain/pressure, no shortness of breath at rest.   No shortness of breath with exertion.  Denies history of atrial fibrillation or irregular heartbeat  Vascular: No history of rest pain in feet.  No history of claudication.  No history of non-healing ulcer, No history of DVT   Pulmonary: No home oxygen, no productive cough, no hemoptysis,  No asthma or wheezing  Musculoskeletal:  [ ]  Arthritis, [ ]  Low back pain,  [ ]  Joint pain  Hematologic:No history of hypercoagulable state.  No history of easy bleeding.  No history of anemia  Gastrointestinal: No hematochezia or melena,  No gastroesophageal reflux, no trouble swallowing  Urinary: [ ]  chronic Kidney disease, [ ]  on HD - [ ]  MWF or [ ]  TTHS, [ ]   Burning with urination, [ ]  Frequent urination, [ ]  Difficulty urinating;   Skin: No rashes  Psychological: No history of anxiety,  No history of depression   Physical Examination  Vitals:   01/14/20 1421  BP: (!) 166/89  Pulse: 72  Resp: 20  Temp: 97.6 F (36.4 C)  SpO2: 95%  Weight: 141 lb (64 kg)  Height: 5\' 6"  (1.676 m)    Body mass index is 22.76 kg/m.  General:  Alert and oriented, no acute distress HEENT: Normal Neck: No JVD Cardiac: Regular Rate and Rhythm Abdomen: Soft, non-tender, non-distended, no mass Skin: No rash Extremity Pulses:  2+ radial, brachial, femoral, absent popliteal dorsalis pedis, posterior tibial pulses bilaterally Musculoskeletal: No deformity or edema  Neurologic: Upper and lower extremity motor 5/5 and symmetric  DATA:  Patient had bilateral ABIs performed on the fifth 2021.  I reviewed and interpreted these today.  Right side 0.75 left side 0.71.  Aortoiliac duplex was also done the same day which showed no significant inflow occlusive disease.  There was no evidence of aneurysm.  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  Patient again was encouraged to quit  smoking.  I discussed with him limited durability of any intervention if he continues to smoke as well as long-term health effects.  2.  Aortogram lower extremity runoff possible intervention scheduled for February 05, 2020.  Risk benefits possible complications of procedure details were discussed with patient today including not limited to bleeding infection vessel injury contrast reaction.  He understands and agrees to proceed.   Ruta Hinds, MD [facility] Office: [phone]

## 2020-02-01 ENCOUNTER — Other Ambulatory Visit: Payer: Self-pay

## 2020-02-05 ENCOUNTER — Ambulatory Visit (HOSPITAL_COMMUNITY)
Admission: RE | Admit: 2020-02-05 | Discharge: 2020-02-05 | Disposition: A | Discharge status: Home / Self Care | Payer: 59 | Attending: Vascular Surgery | Admitting: Vascular Surgery

## 2020-02-05 ENCOUNTER — Ambulatory Visit (HOSPITAL_COMMUNITY)
Admission: RE | Disposition: A | Discharge status: Home / Self Care | Payer: Self-pay | Source: Home / Self Care | Attending: Vascular Surgery

## 2020-02-05 ENCOUNTER — Other Ambulatory Visit: Payer: Self-pay

## 2020-02-05 ENCOUNTER — Encounter: Payer: Self-pay | Admitting: Vascular Surgery

## 2020-02-05 DIAGNOSIS — E785 Hyperlipidemia, unspecified: Secondary | ICD-10-CM | POA: Diagnosis not present

## 2020-02-05 DIAGNOSIS — Z7982 Long term (current) use of aspirin: Secondary | ICD-10-CM | POA: Insufficient documentation

## 2020-02-05 DIAGNOSIS — I10 Essential (primary) hypertension: Secondary | ICD-10-CM | POA: Insufficient documentation

## 2020-02-05 DIAGNOSIS — R7303 Prediabetes: Secondary | ICD-10-CM | POA: Insufficient documentation

## 2020-02-05 DIAGNOSIS — I252 Old myocardial infarction: Secondary | ICD-10-CM | POA: Insufficient documentation

## 2020-02-05 DIAGNOSIS — Z87891 Personal history of nicotine dependence: Secondary | ICD-10-CM | POA: Diagnosis not present

## 2020-02-05 DIAGNOSIS — I70212 Atherosclerosis of native arteries of extremities with intermittent claudication, left leg: Secondary | ICD-10-CM | POA: Diagnosis not present

## 2020-02-05 DIAGNOSIS — I2510 Atherosclerotic heart disease of native coronary artery without angina pectoris: Secondary | ICD-10-CM | POA: Diagnosis not present

## 2020-02-05 DIAGNOSIS — Z79899 Other long term (current) drug therapy: Secondary | ICD-10-CM | POA: Insufficient documentation

## 2020-02-05 DIAGNOSIS — I70223 Atherosclerosis of native arteries of extremities with rest pain, bilateral legs: Secondary | ICD-10-CM

## 2020-02-05 DIAGNOSIS — Z955 Presence of coronary angioplasty implant and graft: Secondary | ICD-10-CM | POA: Insufficient documentation

## 2020-02-05 DIAGNOSIS — I739 Peripheral vascular disease, unspecified: Secondary | ICD-10-CM | POA: Diagnosis present

## 2020-02-05 HISTORY — PX: PERIPHERAL VASCULAR INTERVENTION: CATH118257

## 2020-02-05 HISTORY — PX: ABDOMINAL AORTOGRAM W/LOWER EXTREMITY: CATH118223

## 2020-02-05 LAB — POCT I-STAT, CHEM 8
BUN: 11 mg/dL (ref 6–20)
Calcium, Ion: 1.29 mmol/L (ref 1.15–1.40)
Chloride: 102 mmol/L (ref 98–111)
Creatinine, Ser: 1 mg/dL (ref 0.61–1.24)
Glucose, Bld: 106 mg/dL — ABNORMAL HIGH (ref 70–99)
HCT: 45 % (ref 39.0–52.0)
Hemoglobin: 15.3 g/dL (ref 13.0–17.0)
Potassium: 5.1 mmol/L (ref 3.5–5.1)
Sodium: 142 mmol/L (ref 135–145)
TCO2: 32 mmol/L (ref 22–32)

## 2020-02-05 LAB — POCT ACTIVATED CLOTTING TIME
Activated Clotting Time: 208 seconds
Activated Clotting Time: 230 seconds
Activated Clotting Time: 191 seconds
Activated Clotting Time: 169 seconds

## 2020-02-05 SURGERY — ABDOMINAL AORTOGRAM W/LOWER EXTREMITY
Anesthesia: LOCAL | Laterality: Left

## 2020-02-05 MED ORDER — MORPHINE SULFATE (PF) 4 MG/ML IV SOLN
INTRAVENOUS | Status: AC
  Filled 2020-02-05: qty 1

## 2020-02-05 MED ORDER — ONDANSETRON HCL 4 MG/2ML IJ SOLN: 4.0000 mg | Freq: Four times a day (QID) | INTRAMUSCULAR | Status: DC | PRN

## 2020-02-05 MED ORDER — LIDOCAINE HCL (PF) 1 % IJ SOLN
INTRAMUSCULAR | Status: AC
  Filled 2020-02-05: qty 30

## 2020-02-05 MED ORDER — LIDOCAINE HCL (PF) 1 % IJ SOLN
INTRAMUSCULAR | Status: DC | PRN
  Administered 2020-02-05: 15 mL

## 2020-02-05 MED ORDER — HEPARIN SODIUM (PORCINE) 1000 UNIT/ML IJ SOLN
INTRAMUSCULAR | Status: DC | PRN
  Administered 2020-02-05: 5500 [IU] via INTRAVENOUS
  Administered 2020-02-05: 2000 [IU] via INTRAVENOUS

## 2020-02-05 MED ORDER — HEPARIN SODIUM (PORCINE) 1000 UNIT/ML IJ SOLN
INTRAMUSCULAR | Status: AC
  Filled 2020-02-05: qty 1

## 2020-02-05 MED ORDER — MIDAZOLAM HCL 5 MG/5ML IJ SOLN
INTRAMUSCULAR | Status: AC
  Filled 2020-02-05: qty 5

## 2020-02-05 MED ORDER — MIDAZOLAM HCL 2 MG/2ML IJ SOLN
INTRAMUSCULAR | Status: DC | PRN
  Administered 2020-02-05: 1 mg via INTRAVENOUS

## 2020-02-05 MED ORDER — IODIXANOL 320 MG/ML IV SOLN
INTRAVENOUS | Status: DC | PRN
  Administered 2020-02-05: 180 mL

## 2020-02-05 MED ORDER — HYDRALAZINE HCL 20 MG/ML IJ SOLN
INTRAMUSCULAR | Status: AC
  Filled 2020-02-05: qty 1

## 2020-02-05 MED ORDER — HEPARIN (PORCINE) IN NACL 1000-0.9 UT/500ML-% IV SOLN
INTRAVENOUS | Status: DC | PRN
  Administered 2020-02-05 (×2): 500 mL

## 2020-02-05 MED ORDER — SODIUM CHLORIDE 0.9 % IV SOLN: INTRAVENOUS | Status: DC

## 2020-02-05 MED ORDER — SODIUM CHLORIDE 0.9 % WEIGHT BASED INFUSION: 1.0000 mL/kg/h | INTRAVENOUS | Status: DC

## 2020-02-05 MED ORDER — HYDRALAZINE HCL 20 MG/ML IJ SOLN
5.0000 mg | INTRAMUSCULAR | Status: DC | PRN
  Administered 2020-02-05: 5 mg via INTRAVENOUS

## 2020-02-05 MED ORDER — MORPHINE SULFATE (PF) 4 MG/ML IV SOLN
4.0000 mg | INTRAVENOUS | Status: DC | PRN
  Administered 2020-02-05: 4 mg via INTRAVENOUS

## 2020-02-05 MED ORDER — FENTANYL CITRATE (PF) 100 MCG/2ML IJ SOLN
INTRAMUSCULAR | Status: AC
  Filled 2020-02-05: qty 2

## 2020-02-05 MED ORDER — LABETALOL HCL 5 MG/ML IV SOLN: 10.0000 mg | INTRAVENOUS | Status: DC | PRN

## 2020-02-05 MED ORDER — HEPARIN (PORCINE) IN NACL 1000-0.9 UT/500ML-% IV SOLN
INTRAVENOUS | Status: AC
  Filled 2020-02-05: qty 1000

## 2020-02-05 MED ORDER — FENTANYL CITRATE (PF) 100 MCG/2ML IJ SOLN
INTRAMUSCULAR | Status: DC | PRN
  Administered 2020-02-05: 50 ug via INTRAVENOUS

## 2020-02-05 MED ORDER — CLOPIDOGREL BISULFATE 75 MG PO TABS
75.0000 mg | ORAL_TABLET | Freq: Every day | ORAL | 11 refills | Status: DC
Start: 2020-02-05 — End: 2020-12-26

## 2020-02-05 MED ORDER — CLOPIDOGREL BISULFATE 300 MG PO TABS
ORAL_TABLET | ORAL | Status: DC | PRN
  Administered 2020-02-05: 300 mg via ORAL

## 2020-02-05 MED ORDER — CLOPIDOGREL BISULFATE 300 MG PO TABS
ORAL_TABLET | ORAL | Status: AC
  Filled 2020-02-05: qty 1

## 2020-02-05 SURGICAL SUPPLY — 21 items
BALLN MUSTANG 4X20X135 (BALLOONS) ×3
BALLOON MUSTANG 4X20X135 (BALLOONS) ×2 IMPLANT
CATH ANGIO 5F PIGTAIL 65CM (CATHETERS) ×3 IMPLANT
CATH CROSS OVER TEMPO 5F (CATHETERS) ×3 IMPLANT
CATH STRAIGHT 5FR 65CM (CATHETERS) ×3 IMPLANT
GUIDEWIRE ANGLED .035X150CM (WIRE) ×3 IMPLANT
KIT ENCORE 26 ADVANTAGE (KITS) ×3 IMPLANT
KIT MICROPUNCTURE NIT STIFF (SHEATH) ×3 IMPLANT
KIT PV (KITS) ×3 IMPLANT
SHEATH BRITE TIP 6FR 35CM (SHEATH) ×3 IMPLANT
SHEATH PINNACLE 5F 10CM (SHEATH) ×3 IMPLANT
SHEATH PINNACLE ST 6F 45CM (SHEATH) ×3 IMPLANT
SHEATH PROBE COVER 6X72 (BAG) ×3 IMPLANT
STENT ABSOLUTE PRO 6X30X135 (Permanent Stent) ×3 IMPLANT
STOPCOCK MORSE 400PSI 3WAY (MISCELLANEOUS) ×3 IMPLANT
SYR MEDRAD MARK 7 150ML (SYRINGE) ×3 IMPLANT
TRANSDUCER W/STOPCOCK (MISCELLANEOUS) ×3 IMPLANT
TRAY PV CATH (CUSTOM PROCEDURE TRAY) ×3 IMPLANT
TUBING CIL FLEX 10 FLL-RA (TUBING) ×3 IMPLANT
WIRE HI TORQ VERSACORE J 260CM (WIRE) ×3 IMPLANT
WIRE ROSEN-J .035X180CM (WIRE) ×3 IMPLANT

## 2020-02-05 NOTE — Op Note (Signed)
PATIENT: Jonathan Fleming      MRN: [id number] DOB: 10/30/1960    DATE OF PROCEDURE: 02/05/2020  INDICATIONS:    Jonathan Fleming is a 59 y.o. male who was seen by Dr. Ruta Hinds on 01/14/2020 with disabling bilateral lower extremity claudication.  He was set up for an arteriogram.   PROCEDURE:    1.  Conscious sedation 2.  Ultrasound-guided access to the right common femoral artery 3.  Aortogram with bilateral iliac arteriogram and bilateral lower extremity runoff 4.  Second order catheterization of the left external iliac artery 5.  Angioplasty and stenting of the left external iliac artery 6.  Retrograde right femoral arteriogram  SURGEON: Judeth Cornfield. Scot Dock, MD, FACS  ANESTHESIA: Local with sedation  EBL: Minimal  TECHNIQUE: The patient was brought to the peripheral vascular lab and was sedated. The period of conscious sedation was 78 minutes.  During that time period, I was present face-to-face 100% of the time.  The patient was administered 1 mg of Versed and 50 mcg of fentanyl. The patient's heart rate, blood pressure, and oxygen saturation were monitored by the nurse continuously during the procedure.  Both groins were prepped and draped in the usual sterile fashion.  Under ultrasound guidance, after the skin was anesthetized, I cannulated the right common femoral artery with a micropuncture needle and a micropuncture sheath was introduced over a wire.  This was exchanged for a 5 [place] sheath over a Bentson wire.  By ultrasound the femoral artery was patent. A real-time image was obtained and sent to the server.  The pigtail catheter was positioned at the L1 vertebral body and flush aortogram obtained.  Oblique iliac projections were obtained.  Next bilateral lower extremity runoff films were obtained.  Next the pigtail catheter was exchanged for a crossover catheter which was positioned into the left common iliac artery I advanced the versa core wire through the stenosis  on the left into the superficial femoral artery and then using a straight catheter exchanged this for a Rosen wire for better support.  I then advanced a 6 [place] destination sheath over the bifurcation after the 5 French sheath was removed and positioned this above the stenosis.  The patient was then heparinized.  ACT was monitored throughout the procedure.  An iliac arteriogram was obtained to measure the stenosis.  This was just above the inguinal ligament.  I selected a 6 mm x 30 mm self-expanding stent.  This was positioned just below the stenosis and carefully deployed just above the inguinal ligament.  Postdilatation was done with a 4 mm x 20 mm balloon which was inflated at the proximal stent first and then at the distal stent.  Completion film showed no residual stenosis.  There was a good size match proximally and distally.  Next to get better visualization on the right the wire was removed and then the sheath retracted over to the right external iliac artery.  A retrograde right femoral arteriogram was obtained to better visualize the tibials distally.  At the completion of the procedure the patient was transferred to the holding area for removal of the sheath.  No immediate complications were noted.  FINDINGS:   RENALS AND AORTA: He has single renal arteries bilaterally with no significant renal artery stenosis identified.  His infrarenal aorta has some very mild narrowing distally with calcific plaque but no significant narrowing.  INFLOW RIGHT LOWER EXTREMITY: He does have calcific disease in the common iliac artery but no focal  stenosis.  The hypogastric artery is occluded.  The right external iliac artery has mild calcific plaque with no focal stenosis.  RIGHT LOWER EXTREMITY RUNOFF: On the right side he has a patent common femoral and deep femoral artery.  The superficial femoral artery has severe disease proximally and is occluded proximally.  There is reconstitution of the above-knee  popliteal artery.  The above-knee and below-knee popliteal arteries are patent.  There is three-vessel runoff on the right via the anterior tibial, posterior tibial, and peroneal arteries which are all small.  There was poor visualization of the dorsalis pedis on the right because of the proximal occlusion.  INFLOW LEFT LOWER EXTREMITY: The common iliac artery is patent on the left.  The hypogastric artery is occluded.  The external iliac artery was patent but there was a 90% stenosis distally just above the inguinal ligament.  This was successfully addressed with angioplasty and stenting as described above.  LEFT LOWER EXTREMITY RUNOFF: The common femoral, deep femoral, superficial femoral arteries are patent.  There is some moderate calcific disease distally.  The popliteal artery is patent.  There is three-vessel runoff in the left via the anterior tibial, posterior tibial, and peroneal arteries which are all small.  TASC Classification A  Largest Sheath Size: 6 Fr  Target vessel: Left external iliac artery  % Stenosis: Pre 90%. Post 0%.  Lesion length: 2 cm  Calcification: Minimal  Most impactful devices used (Up to 3): 6 mm x 30 mm self-expanding stent (absolute Pro), postdilatation with 4 mm x 20 mm balloon  Outflow: Disease present or not distal to the lesion treated and the  Flow in the distal vessel: Good runoff distally   CLINICAL NOTE: He is on aspirin and a statin.  I will add Plavix.  We will have him follow-up with Dr. Darrick Penna in 3 to 4 weeks.  Waverly Ferrari, MD, FACS [facility]  DATE OF DICTATION:   02/05/2020

## 2020-02-05 NOTE — Interval H&P Note (Signed)
History and Physical Interval Note:  02/05/2020 9:43 AM  Jonathan Fleming  has presented today for surgery, with the diagnosis of peripheral vascular disease.  The various methods of treatment have been discussed with the patient and family. After consideration of risks, benefits and other options for treatment, the patient has consented to  Procedure(s): ABDOMINAL AORTOGRAM W/LOWER EXTREMITY (N/A) as a surgical intervention.  The patient's history has been reviewed, patient examined, no change in status, stable for surgery.  I have reviewed the patient's chart and labs.  Questions were answered to the patient's satisfaction.     Waverly Ferrari

## 2020-02-05 NOTE — Discharge Instructions (Signed)
Femoral Site Care This sheet gives you information about how to care for yourself after your procedure. Your health care provider may also give you more specific instructions. If you have problems or questions, contact your health care provider. What can I expect after the procedure? After the procedure, it is common to have:  Bruising that usually fades within 1-2 weeks.  Tenderness at the site. Follow these instructions at home: Wound care  Follow instructions from your health care provider about how to take care of your insertion site. Make sure you: ? Wash your hands with soap and water before you change your bandage (dressing). If soap and water are not available, use hand sanitizer. ? Change your dressing as told by your health care provider. ? Leave stitches (sutures), skin glue, or adhesive strips in place. These skin closures may need to stay in place for 2 weeks or longer. If adhesive strip edges start to loosen and curl up, you may trim the loose edges. Do not remove adhesive strips completely unless your health care provider tells you to do that.  Do not take baths, swim, or use a hot tub until your health care provider approves.  You may shower 24-48 hours after the procedure or as told by your health care provider. ? Gently wash the site with plain soap and water. ? Pat the area dry with a clean towel. ? Do not rub the site. This may cause bleeding.  Do not apply powder or lotion to the site. Keep the site clean and dry.  Check your femoral site every day for signs of infection. Check for: ? Redness, swelling, or pain. ? Fluid or blood. ? Warmth. ? Pus or a bad smell. Activity  For the first 2-3 days after your procedure, or as long as directed: ? Avoid climbing stairs as much as possible. ? Do not squat.  Do not lift anything that is heavier than 10 lb (4.5 kg), or the limit that you are told, until your health care provider says that it is safe.  Rest as  directed. ? Avoid sitting for a long time without moving. Get up to take short walks every 1-2 hours.  Do not drive for 24 hours if you were given a medicine to help you relax (sedative). General instructions  Take over-the-counter and prescription medicines only as told by your health care provider.  Keep all follow-up visits as told by your health care provider. This is important. Contact a health care provider if you have:  A fever or chills.  You have redness, swelling, or pain around your insertion site. Get help right away if:  The catheter insertion area swells very fast.  You pass out.  You suddenly start to sweat or your skin gets clammy.  The catheter insertion area is bleeding, and the bleeding does not stop when you hold steady pressure on the area.  The area near or just beyond the catheter insertion site becomes pale, cool, tingly, or numb. These symptoms may represent a serious problem that is an emergency. Do not wait to see if the symptoms will go away. Get medical help right away. Call your local emergency services (911 in the [place] Do not drive yourself to the hospital. Summary  After the procedure, it is common to have bruising that usually fades within 1-2 weeks.  Check your femoral site every day for signs of infection.  Do not lift anything that is heavier than 10 lb (4.5 kg), or the   limit that you are told, until your health care provider says that it is safe. This information is not intended to replace advice given to you by your health care provider. Make sure you discuss any questions you have with your health care provider. Document Revised: 08/12/2017 Document Reviewed: 08/12/2017 Elsevier Patient Education  2020 Elsevier Inc.  

## 2020-02-05 NOTE — Progress Notes (Signed)
Site area: Right groin a 6 french arterial long sheath was removed  Site Prior to Removal:  Level 0  Pressure Applied For 30 MINUTES    Bedrest Beginning at 1410pm  Manual:   Yes.    Patient Status During Pull:  stable  Post Pull Groin Site:  Level 0  Post Pull Instructions Given:  Yes.    Post Pull Pulses Present:  Yes.    Dressing Applied:  Yes.    Comments:

## 2020-02-05 NOTE — Interval H&P Note (Signed)
History and Physical Interval Note:  02/05/2020 9:43 AM  Jonathan Fleming  has presented today for surgery, with the diagnosis of peripheral vascular disease.  The various methods of treatment have been discussed with the patient and family. After consideration of risks, benefits and other options for treatment, the patient has consented to  Procedure(s): ABDOMINAL AORTOGRAM W/LOWER EXTREMITY (N/A) as a surgical intervention.  The patient's history has been reviewed, patient examined, no change in status, stable for surgery.  I have reviewed the patient's chart and labs.  Questions were answered to the patient's satisfaction.     [name]   

## 2020-02-08 ENCOUNTER — Encounter (HOSPITAL_COMMUNITY): Payer: Self-pay | Admitting: Vascular Surgery

## 2020-02-08 MED FILL — Midazolam HCl Inj 5 MG/5ML (Base Equivalent): INTRAMUSCULAR | Qty: 1 | Status: AC

## 2020-03-03 ENCOUNTER — Ambulatory Visit (HOSPITAL_COMMUNITY)
Admission: RE | Admit: 2020-03-03 | Discharge: 2020-03-03 | Disposition: A | Discharge status: Home / Self Care | Payer: 59 | Source: Ambulatory Visit | Attending: Vascular Surgery | Admitting: Vascular Surgery

## 2020-03-03 ENCOUNTER — Ambulatory Visit (INDEPENDENT_AMBULATORY_CARE_PROVIDER_SITE_OTHER): Payer: 59 | Admitting: Vascular Surgery

## 2020-03-03 ENCOUNTER — Encounter: Payer: Self-pay | Admitting: Vascular Surgery

## 2020-03-03 ENCOUNTER — Other Ambulatory Visit (HOSPITAL_COMMUNITY): Payer: Self-pay | Admitting: Vascular Surgery

## 2020-03-03 ENCOUNTER — Other Ambulatory Visit: Payer: Self-pay

## 2020-03-03 VITALS — BP 166/82 | HR 63 | Temp 98.2°F | Resp 20 | Ht 66.0 in | Wt 140.0 lb

## 2020-03-03 DIAGNOSIS — I739 Peripheral vascular disease, unspecified: Secondary | ICD-10-CM

## 2020-03-03 NOTE — Progress Notes (Signed)
Patient is a 59 year old male who returns for follow-up today.  He recently underwent left external iliac artery stenting for claudication.  He reports he still has some soreness in his left leg but his claudication symptoms are significantly improved.  He still has claudication in the right leg at about half a block.  In the left leg he had a stenosis in the left external iliac artery but his superficial profunda popliteal and tibial vessels were all open on the left side.  He has a long SFA occlusion on the right side which is not amenable to percutaneous intervention.  Unfortunately he is still smoking.  He is trying to quit.  Review of systems: He has no chest pain.  He has no shortness of breath.  Past Medical History:  Diagnosis Date   Angina pectoris (HCC) 07/18/2016   Added automatically from request for surgery [id number]   CAD in native artery 02/21/2016   1997 DX/RX: Age 22. NSTEMI Inferior, Stent Circ x 2. MID LAD 70-80%  2009 Cardiolite Neg Coronary angiography 07/19/16: Conclusions Diagnostic Summary Culprit lesion is prox LAD--95%+ with TIMI II flow. Cx stent 60% in stent stenosis. Mid RCA has 65% lesion, R-L collaterals to LAD. LV gram shows moderate antero=apical hypokinesis--EF 40-45% Diagnostic Recommendations PCI recommended Interventional   Cholelithiasis 07/14/2016   Coronary artery disease    Current every day smoker 07/14/2016   Dyslipidemia 02/21/2016   Family history of premature coronary artery disease 02/07/2018   Parents, brothers, sisters very young   H/O heart artery stent 03/04/2017   Hyperlipidemia    Hypertension    LV dysfunction 04/03/2018   Myocardial infarction [facility])    age 36 sees [facility] in [place], Stress test done at [facility], Dr. Benedetto Goad has records   Old MI (myocardial infarction) 03/04/2017   Peripheral vascular disease (HCC) 02/21/2016   2007 DX/RX: GEST (+) RLE claudication. s/p angioplasty Rt. Sup. Femoral artery    Prediabetes 02/21/2016   2018: 127/5.5  Formatting of this note might be different from the original. 2018: 127/5.5  , Past Surgical History:  Procedure Laterality Date   ABDOMINAL AORTOGRAM W/LOWER EXTREMITY Bilateral 02/05/2020   Procedure: ABDOMINAL AORTOGRAM W/LOWER EXTREMITY;  Surgeon: Chuck Hint, MD;  Location: [facility] INVASIVE CV LAB;  Service: Cardiovascular;  Laterality: Bilateral;   APPENDECTOMY     CORONARY ANGIOPLASTY     Stents x 2 age 41   LUMBAR LAMINECTOMY/DECOMPRESSION MICRODISCECTOMY  10/01/2011   Procedure: LUMBAR LAMINECTOMY/DECOMPRESSION MICRODISCECTOMY 1 LEVEL;  Surgeon: Cristi Loron, MD;  Location: [facility] NEURO ORS;  Service: Neurosurgery;  Laterality: Right;  Right Lumbar two-three discectomy   PERIPHERAL VASCULAR INTERVENTION Left 02/05/2020   Procedure: PERIPHERAL VASCULAR INTERVENTION;  Surgeon: Chuck Hint, MD;  Location: [facility] INVASIVE CV LAB;  Service: Cardiovascular;  Laterality: Left;  EXT ILIAC    Social History   Socioeconomic History   Marital status: Married    Spouse name: Not on file   Number of children: Not on file   Years of education: Not on file   Highest education level: Not on file  Occupational History   Not on file  Tobacco Use   Smoking status: Former Smoker    Packs/day: 0.50    Years: 20.00    Pack years: 10.00    Types: Cigarettes    Quit date: 06/23/2019    Years since quitting: 0.6   Smokeless tobacco: Never Used  Vaping Use   Vaping Use: Never used  Substance and Sexual  Activity   Alcohol use: No   Drug use: No   Sexual activity: Not on file  Other Topics Concern   Not on file  Social History Narrative   Not on file   Social Determinants of Health   Financial Resource Strain:    Difficulty of Paying Living Expenses:   Food Insecurity:    Worried About Running Out of Food in the Last Year:    Barista in the Last Year:   Transportation Needs:    Freight forwarder  (Medical):    Lack of Transportation (Non-Medical):   Physical Activity:    Days of Exercise per Week:    Minutes of Exercise per Session:   Stress:    Feeling of Stress :   Social Connections:    Frequency of Communication with Friends and Family:    Frequency of Social Gatherings with Friends and Family:    Attends Religious Services:    Active Member of Clubs or Organizations:    Attends Engineer, structural:    Marital Status:   Intimate Partner Violence:    Fear of Current or Ex-Partner:    Emotionally Abused:    Physically Abused:    Sexually Abused:     Family History  Problem Relation Age of Onset   Diabetes Mother 15   Coronary artery disease Mother    Hypertension Mother    Coronary artery disease Father 38   Breast cancer Sister    Coronary artery disease Brother 59   Coronary artery disease Brother 67   Anesthesia problems Neg Hx     Physical exam:  Vitals:   03/03/20 1323  BP: (!) 166/82  Pulse: 63  Resp: 20  Temp: 98.2 F (36.8 C)  SpO2: 97%  Weight: 140 lb (63.5 kg)  Height: 5\' 6"  (1.676 m)    Extremities: 2+ femoral pulses bilaterally absent popliteal pedal pulses right leg 2+ left popliteal pulse absent pedal pulses left foot both feet are pink and warm  Data: Patient had a bilateral ABI performed today which was 0.9 on the left 0.6 on the right  Assessment: Patent left external iliac stent with improved perfusion left leg minimal symptoms at this point  He still has persistent right leg claudication but does not wish to have an operation at this point.  I reassured him that he is not at risk of limb loss currently.  He will also continue to try to quit smoking.  Plan: Patient will try to continue to quit smoking.  He will try to walk 30 minutes daily.  He will follow up in our APP clinic in 3 months.  At that time he will have aortoiliac duplex of his stent as well as bilateral ABIs.  ,  MD [facility] Office: [phone]

## 2020-03-08 ENCOUNTER — Other Ambulatory Visit: Payer: Self-pay | Admitting: *Deleted

## 2020-03-08 DIAGNOSIS — I739 Peripheral vascular disease, unspecified: Secondary | ICD-10-CM

## 2020-03-27 DIAGNOSIS — I252 Old myocardial infarction: Secondary | ICD-10-CM

## 2020-03-27 HISTORY — DX: Old myocardial infarction: I25.2

## 2020-05-27 ENCOUNTER — Ambulatory Visit: Payer: 59

## 2020-05-27 ENCOUNTER — Encounter (HOSPITAL_COMMUNITY): Payer: 59

## 2020-05-27 NOTE — Progress Notes (Deleted)
Office Note     CC:  follow up Requesting Provider:  Olive Bass, MD  HPI: Jonathan Fleming is a 59 y.o. (1961/07/15) male who presents for 30-month follow-up following left external iliac artery stent placement by Dr. Edilia Bo on February 05, 2020 secondary to bilateral lower extremity claudication.  He had been assessed in the office by Dr. Darrick Penna   In the left leg he had a stenosis in the left external iliac artery but his superficial profunda popliteal and tibial vessels were all open on the left side.  He has a long SFA occlusion on the right side which is not amenable to percutaneous intervention. He is compliant with asa, Plavix and statin. Tobacco use: Past Medical History:  Diagnosis Date   Angina pectoris (HCC) 07/18/2016   Added automatically from request for surgery [id number]   CAD in native artery 02/21/2016   1997 DX/RX: Age 65. NSTEMI Inferior, Stent Circ x 2. MID LAD 70-80%  2009 Cardiolite Neg Coronary angiography 07/19/16: Conclusions Diagnostic Summary Culprit lesion is prox LAD--95%+ with TIMI II flow. Cx stent 60% in stent stenosis. Mid RCA has 65% lesion, R-L collaterals to LAD. LV gram shows moderate antero=apical hypokinesis--EF 40-45% Diagnostic Recommendations PCI recommended Interventional   Cholelithiasis 07/14/2016   Coronary artery disease    Current every day smoker 07/14/2016   Dyslipidemia 02/21/2016   Family history of premature coronary artery disease 02/07/2018   Parents, brothers, sisters very young   H/O heart artery stent 03/04/2017   Hyperlipidemia    Hypertension    LV dysfunction 04/03/2018   Myocardial infarction [facility])    age 25 sees [facility] in [place], Stress test done at [facility], Dr. Benedetto Goad has records   Old MI (myocardial infarction) 03/04/2017   Peripheral vascular disease (HCC) 02/21/2016   2007 DX/RX: GEST (+) RLE claudication. s/p angioplasty Rt. Sup. Femoral artery   Prediabetes 02/21/2016   2018: 127/5.5   Formatting of this note might be different from the original. 2018: 127/5.5    Past Surgical History:  Procedure Laterality Date   ABDOMINAL AORTOGRAM W/LOWER EXTREMITY Bilateral 02/05/2020   Procedure: ABDOMINAL AORTOGRAM W/LOWER EXTREMITY;  Surgeon: Chuck Hint, MD;  Location: [facility] INVASIVE CV LAB;  Service: Cardiovascular;  Laterality: Bilateral;   APPENDECTOMY     CORONARY ANGIOPLASTY     Stents x 2 age 76   LUMBAR LAMINECTOMY/DECOMPRESSION MICRODISCECTOMY  10/01/2011   Procedure: LUMBAR LAMINECTOMY/DECOMPRESSION MICRODISCECTOMY 1 LEVEL;  Surgeon: Cristi Loron, MD;  Location: [facility] NEURO ORS;  Service: Neurosurgery;  Laterality: Right;  Right Lumbar two-three discectomy   PERIPHERAL VASCULAR INTERVENTION Left 02/05/2020   Procedure: PERIPHERAL VASCULAR INTERVENTION;  Surgeon: Chuck Hint, MD;  Location: [facility] INVASIVE CV LAB;  Service: Cardiovascular;  Laterality: Left;  EXT ILIAC    Social History   Socioeconomic History   Marital status: Married    Spouse name: Not on file   Number of children: Not on file   Years of education: Not on file   Highest education level: Not on file  Occupational History   Not on file  Tobacco Use   Smoking status: Former Smoker    Packs/day: 0.50    Years: 20.00    Pack years: 10.00    Types: Cigarettes    Quit date: 06/23/2019    Years since quitting: 0.9   Smokeless tobacco: Never Used  Vaping Use   Vaping Use: Never used  Substance and Sexual Activity   Alcohol use: No  Drug use: No   Sexual activity: Not on file  Other Topics Concern   Not on file  Social History Narrative   Not on file   Social Determinants of Health   Financial Resource Strain:    Difficulty of Paying Living Expenses: Not on file  Food Insecurity:    Worried About Running Out of Food in the Last Year: Not on file   Ran Out of Food in the Last Year: Not on file  Transportation Needs:    Lack of Transportation  (Medical): Not on file   Lack of Transportation (Non-Medical): Not on file  Physical Activity:    Days of Exercise per Week: Not on file   Minutes of Exercise per Session: Not on file  Stress:    Feeling of Stress : Not on file  Social Connections:    Frequency of Communication with Friends and Family: Not on file   Frequency of Social Gatherings with Friends and Family: Not on file   Attends Religious Services: Not on file   Active Member of Clubs or Organizations: Not on file   Attends Banker Meetings: Not on file   Marital Status: Not on file  Intimate Partner Violence:    Fear of Current or Ex-Partner: Not on file   Emotionally Abused: Not on file   Physically Abused: Not on file   Sexually Abused: Not on file   Family History  Problem Relation Age of Onset   Diabetes Mother 38   Coronary artery disease Mother    Hypertension Mother    Coronary artery disease Father 96   Breast cancer Sister    Coronary artery disease Brother 59   Coronary artery disease Brother 46   Anesthesia problems Neg Hx     Current Outpatient Medications  Medication Sig Dispense Refill   aspirin EC 81 MG tablet Take 1 tablet (81 mg total) by mouth daily. 90 tablet 3   clopidogrel (PLAVIX) 75 MG tablet Take 1 tablet (75 mg total) by mouth daily. 30 tablet 11   enalapril (VASOTEC) 10 MG tablet Take 10 tablets by mouth 2 (two) times daily.      Glucosamine-Chondroit-Vit C-Mn (GLUCOSAMINE 1500 COMPLEX) CAPS Take 1 capsule by mouth daily.     loratadine (CLARITIN) 10 MG tablet Take 10 mg by mouth daily.     metoprolol tartrate (LOPRESSOR) 100 MG tablet Take 100 mg by mouth 2 (two) times daily.      Multiple Vitamin (MULITIVITAMIN WITH MINERALS) TABS Take 1 tablet by mouth daily.     nitroGLYCERIN (NITROSTAT) 0.4 MG SL tablet Place 1 tablet (0.4 mg total) under the tongue every 5 (five) minutes as needed for chest pain. 25 tablet 12   rosuvastatin (CRESTOR)  40 MG tablet Take 40 mg by mouth every evening.      No current facility-administered medications for this visit.    Allergies  Allergen Reactions   Atorvastatin Other (See Comments)    Increased LFT Weakness per patient    Penicillins Nausea And Vomiting     REVIEW OF SYSTEMS:   [X]  denotes positive finding, [ ]  denotes negative finding Cardiac  Comments:  Chest pain or chest pressure:    Shortness of breath upon exertion:    Short of breath when lying flat:    Irregular heart rhythm:        Vascular    Pain in calf, thigh, or hip brought on by ambulation:    Pain in feet at  night that wakes you up from your sleep:     Blood clot in your veins:    Leg swelling:         Pulmonary    Oxygen at home:    Productive cough:     Wheezing:         Neurologic    Sudden weakness in arms or legs:     Sudden numbness in arms or legs:     Sudden onset of difficulty speaking or slurred speech:    Temporary loss of vision in one eye:     Problems with dizziness:         Gastrointestinal    Blood in stool:     Vomited blood:         Genitourinary    Burning when urinating:     Blood in urine:        Psychiatric    Major depression:         Hematologic    Bleeding problems:    Problems with blood clotting too easily:        Skin    Rashes or ulcers:        Constitutional    Fever or chills:      PHYSICAL EXAMINATION:  There were no vitals filed for this visit.  General:  WDWN in NAD; vital signs documented above Gait: Not observed HENT: WNL, normocephalic Pulmonary: normal non-labored breathing , without Rales, rhonchi,  wheezing Cardiac: regular HR, without  Murmurs without carotid bruit Abdomen: soft, NT, no masses Skin: without rashes Vascular Exam/Pulses: Extremities: without ischemic changes, without Gangrene , without cellulitis; without open wounds;  Musculoskeletal: no muscle wasting or atrophy  Neurologic: A&O X 3;  No focal weakness or  paresthesias are detected Psychiatric:  The pt has Normal affect.   Non-Invasive Vascular Imaging:   05/27/2020    ASSESSMENT/PLAN:: 59 y.o. male here for follow up for ***    Milinda Antis, PA-C [facility] [phone]  Clinic MD:   Randie Heinz

## 2020-06-01 ENCOUNTER — Ambulatory Visit (HOSPITAL_COMMUNITY)
Admission: RE | Admit: 2020-06-01 | Discharge: 2020-06-01 | Disposition: A | Discharge status: Home / Self Care | Payer: 59 | Source: Ambulatory Visit | Attending: Physician Assistant | Admitting: Physician Assistant

## 2020-06-01 ENCOUNTER — Telehealth: Payer: Self-pay

## 2020-06-01 ENCOUNTER — Other Ambulatory Visit: Payer: Self-pay

## 2020-06-01 ENCOUNTER — Ambulatory Visit (INDEPENDENT_AMBULATORY_CARE_PROVIDER_SITE_OTHER): Payer: 59 | Admitting: Physician Assistant

## 2020-06-01 ENCOUNTER — Ambulatory Visit (INDEPENDENT_AMBULATORY_CARE_PROVIDER_SITE_OTHER)
Admission: RE | Admit: 2020-06-01 | Discharge: 2020-06-01 | Disposition: A | Discharge status: Home / Self Care | Payer: 59 | Source: Ambulatory Visit | Attending: Vascular Surgery | Admitting: Vascular Surgery

## 2020-06-01 VITALS — BP 173/85 | HR 58 | Temp 98.7°F | Resp 20 | Ht 66.0 in | Wt 139.5 lb

## 2020-06-01 DIAGNOSIS — I739 Peripheral vascular disease, unspecified: Secondary | ICD-10-CM

## 2020-06-01 DIAGNOSIS — I70219 Atherosclerosis of native arteries of extremities with intermittent claudication, unspecified extremity: Secondary | ICD-10-CM

## 2020-06-01 NOTE — Telephone Encounter (Signed)
Patient was seen today at [facility].  He is reluctant to schedule a follow up arteriogram because he says his insurance is denying him coverage based on old BCBS coverage.  BCBS was still showing active after he ended employment and no longer had BCBS coverage.  He says he only has [employer] coverage now.  Pt states he is being charged $19,000 from his first procedure in June.  I assured pt this case is being investigated.  There are several Hospital Accounting Notes concerning this case.  Pt will discuss this with his wife and may schedule another needed procedure depending on the outcome of this case.  Ernst Spell., LPN

## 2020-06-01 NOTE — Progress Notes (Signed)
Office Note     CC:  follow up Requesting Provider:  Olive Bassough, Robert L, MD  HPI: Jonathan Fleming is a 59 y.o. (Mar 03, 1961) male who presents for follow-up of LE claudication and angioplasty and stenting of the left external iliac artery by Dr. Edilia Boickson on 02/05/2020.  He continues to complain of bilateral lower extremity claudication at approximately one half block.  He states this resolves within 1 minute.  He does not complain of one leg being more painful than the other.  He denies rest pain.  He is compliant with his aspirin, statin and Plavix. He continues to smoke and continues to work on quitting. He states his coronary artery disease is stable and denies chest pain or shortness of breath.  Past Medical History:  Diagnosis Date  . Angina pectoris (HCC) 07/18/2016   Added automatically from request for surgery [id number]  . CAD in native artery 02/21/2016   1997 DX/RX: Age 59. NSTEMI Inferior, Stent Circ x 2. MID LAD 70-80%  2009 Cardiolite Neg Coronary angiography 07/19/16: Conclusions Diagnostic Summary Culprit lesion is prox LAD--95%+ with TIMI II flow. Cx stent 60% in stent stenosis. Mid RCA has 65% lesion, R-L collaterals to LAD. LV gram shows moderate antero=apical hypokinesis--EF 40-45% Diagnostic Recommendations PCI recommended Interventional  . Cholelithiasis 07/14/2016  . Coronary artery disease   . Current every day smoker 07/14/2016  . Dyslipidemia 02/21/2016  . Family history of premature coronary artery disease 02/07/2018   Parents, brothers, sisters very young  . H/O heart artery stent 03/04/2017  . Hyperlipidemia   . Hypertension   . LV dysfunction 04/03/2018  . Myocardial infarction [facility](HCC)    age 59 sees [facility] in [place], [facility], Dr. Benedetto GoadShevlin has records  . Old MI (myocardial infarction) 03/04/2017  . Peripheral vascular disease (HCC) 02/21/2016   2007 DX/RX: GEST (+) RLE claudication. s/p angioplasty Rt. Sup. Femoral artery  .  Prediabetes 02/21/2016   2018: 127/5.5  Formatting of this note might be different from the original. 2018: 127/5.5    Past Surgical History:  Procedure Laterality Date  . ABDOMINAL AORTOGRAM W/LOWER EXTREMITY Bilateral 02/05/2020   Procedure: ABDOMINAL AORTOGRAM W/LOWER EXTREMITY;  Surgeon: Chuck Hintickson, Christopher S, MD;  [facility] CV LAB;  Service: Cardiovascular;  Laterality: Bilateral;  . APPENDECTOMY    . CORONARY ANGIOPLASTY     Stents x 2 age 89  . LUMBAR LAMINECTOMY/DECOMPRESSION MICRODISCECTOMY  10/01/2011   Procedure: LUMBAR LAMINECTOMY/DECOMPRESSION MICRODISCECTOMY 1 LEVEL;  Surgeon: Cristi LoronJeffrey D Jenkins, MD;  Location: [facility] NEURO ORS;  Service: Neurosurgery;  Laterality: Right;  Right Lumbar two-three discectomy  . PERIPHERAL VASCULAR INTERVENTION Left 02/05/2020   Procedure: PERIPHERAL VASCULAR INTERVENTION;  Surgeon: Chuck Hintickson, Christopher S, MD;  [facility];  Service: Cardiovascular;  Laterality: Left;  EXT ILIAC    Social History   Socioeconomic History  . Marital status: Married    Spouse name: Not on file  . Number of children: Not on file  . Years of education: Not on file  . Highest education level: Not on file  Occupational History  . Not on file  Tobacco Use  . Smoking status: Former Smoker    Packs/day: 0.50    Years: 20.00    Pack years: 10.00    Types: Cigarettes    Quit date: 06/23/2019    Years since quitting: 0.9  . Smokeless tobacco: Never Used  Vaping Use  . Vaping Use: Never used  Substance and Sexual Activity  .  Alcohol use: No  . Drug use: No  . Sexual activity: Not on file  Other Topics Concern  . Not on file  Social History Narrative  . Not on file   Social Determinants of Health   Financial Resource Strain:   . Difficulty of Paying Living Expenses: Not on file  Food Insecurity:   . Worried About Programme researcher, broadcasting/film/video in the Last Year: Not on file  . Ran Out of Food in the Last Year: Not on file  Transportation  Needs:   . Lack of Transportation (Medical): Not on file  . Lack of Transportation (Non-Medical): Not on file  Physical Activity:   . Days of Exercise per Week: Not on file  . Minutes of Exercise per Session: Not on file  Stress:   . Feeling of Stress : Not on file  Social Connections:   . Frequency of Communication with Friends and Family: Not on file  . Frequency of Social Gatherings with Friends and Family: Not on file  . Attends Religious Services: Not on file  . Active Member of Clubs or Organizations: Not on file  . Attends Banker Meetings: Not on file  . Marital Status: Not on file  Intimate Partner Violence:   . Fear of Current or Ex-Partner: Not on file  . Emotionally Abused: Not on file  . Physically Abused: Not on file  . Sexually Abused: Not on file   Family History  Problem Relation Age of Onset  . Diabetes Mother 50  . Coronary artery disease Mother   . Hypertension Mother   . Coronary artery disease Father 31  . Breast cancer Sister   . Coronary artery disease Brother 52  . Coronary artery disease Brother 3  . Anesthesia problems Neg Hx     Current Outpatient Medications  Medication Sig Dispense Refill  . aspirin EC 81 MG tablet Take 1 tablet (81 mg total) by mouth daily. 90 tablet 3  . clopidogrel (PLAVIX) 75 MG tablet Take 1 tablet (75 mg total) by mouth daily. 30 tablet 11  . enalapril (VASOTEC) 10 MG tablet Take 10 tablets by mouth 2 (two) times daily.     . Glucosamine-Chondroit-Vit C-Mn (GLUCOSAMINE 1500 COMPLEX) CAPS Take 1 capsule by mouth daily.    Marland Kitchen loratadine (CLARITIN) 10 MG tablet Take 10 mg by mouth daily.    . metoprolol tartrate (LOPRESSOR) 100 MG tablet Take 100 mg by mouth 2 (two) times daily.     . Multiple Vitamin (MULITIVITAMIN WITH MINERALS) TABS Take 1 tablet by mouth daily.    . nitroGLYCERIN (NITROSTAT) 0.4 MG SL tablet Place 1 tablet (0.4 mg total) under the tongue every 5 (five) minutes as needed for chest pain. 25  tablet 12  . rosuvastatin (CRESTOR) 40 MG tablet Take 40 mg by mouth every evening.      No current facility-administered medications for this visit.    Allergies  Allergen Reactions  . Atorvastatin Other (See Comments)    Increased LFT Weakness per patient   . Penicillins Nausea And Vomiting     REVIEW OF SYSTEMS:   [X]  denotes positive finding, [ ]  denotes negative finding Cardiac  Comments:  Chest pain or chest pressure:    Shortness of breath upon exertion:    Short of breath when lying flat:    Irregular heart rhythm:        Vascular    Pain in calf, thigh, or hip brought on by ambulation: x  Pain in feet at night that wakes you up from your sleep:     Blood clot in your veins:    Leg swelling:         Pulmonary    Oxygen at home:    Productive cough:     Wheezing:         Neurologic    Sudden weakness in arms or legs:     Sudden numbness in arms or legs:     Sudden onset of difficulty speaking or slurred speech:    Temporary loss of vision in one eye:     Problems with dizziness:         Gastrointestinal    Blood in stool:     Vomited blood:         Genitourinary    Burning when urinating:     Blood in urine:        Psychiatric    Major depression:         Hematologic    Bleeding problems:    Problems with blood clotting too easily:        Skin    Rashes or ulcers:        Constitutional    Fever or chills:      PHYSICAL EXAMINATION:  Vitals:   06/01/20 0918  BP: (!) 173/85  Pulse: (!) 58  Resp: 20  Temp: 98.7 F (37.1 C)  TempSrc: Temporal  SpO2: 99%  Weight: 139 lb 8 oz (63.3 kg)  Height: 5\' 6"  (1.676 m)    General:  WDWN in NAD; vital signs documented above Gait: Not observed HENT: WNL, normocephalic Pulmonary: normal non-labored breathing , without Rales, rhonchi,  wheezing Cardiac: regular HR, without  Murmurs without carotid bruit Abdomen: soft, NT, no masses Skin: without rashes Vascular Exam/Pulses: Pedal pulses are  not palpable.  Unable to get brisk posterior tibial artery Doppler signals bilaterally.  Dampened peroneal signal on the left. Extremities: without ischemic changes, without Gangrene , without cellulitis; without open wounds;  Musculoskeletal: no muscle wasting or atrophy  Neurologic: A&O X 3;  No focal weakness or paresthesias are detected Psychiatric:  The pt has Normal affect.   Non-Invasive Vascular Imaging:   06/01/2020 ABI/TBIToday's ABIToday's TBIPrevious ABIPrevious TBI  +-------+-----------+-----------+------------+------------+  Right 0.68    0.48    0.62    0.52      +-------+-----------+-----------+------------+------------+  Left  0.84    0.69    0.91    0.76       Right TP = 80    Monophasic waveforms Left TP = 116.   Triphasic and biphasic waveforms  Duplex examination of the left iliac stent reviewed.  Consistent with approximate 50 to 99% stenosis throughout the stent with a maximal velocity of 693 cm/s.  ASSESSMENT/PLAN:: 59 y.o. male here for follow up for peripheral arterial disease and bilateral lower extremity claudication.  The patient symptoms are similar to 3 months ago after his external iliac artery stent was placed.  He does not have rest pain or tissue loss.  I reviewed his noninvasive studies today with Dr. 41 who recommends arteriography. Due to outstanding medical bills from his initial arteriogram in June, the patient does not desire to schedule arteriogram today.  He was able to speak with office staff today in regards to his outstanding bills and given guidance on resolving this. I explained to the patient that I am unable to predict timing of whether he will develop symptoms from impending  stent occlusion and that he would need to notify us immediately if symptoms develop or worsen.  Hopefully he can resolve the insurance issues and schedule his arteriogram soon as possible.  I informed him I would make a  follow-up appointment in 3 months with repeat studies regardless.  He verbalizes understanding of this plan. He is again encouraged to stop smoking.  Milinda Antis, PA-C [facility] [phone]  Clinic MD:   Darrick Penna

## 2020-06-06 ENCOUNTER — Other Ambulatory Visit: Payer: Self-pay

## 2020-12-23 ENCOUNTER — Other Ambulatory Visit: Payer: Self-pay | Admitting: Cardiology

## 2020-12-26 NOTE — Telephone Encounter (Signed)
Refill sent to pharmacy.   

## 2021-02-06 DIAGNOSIS — I10 Essential (primary) hypertension: Secondary | ICD-10-CM | POA: Insufficient documentation

## 2021-02-10 ENCOUNTER — Encounter: Payer: Self-pay | Admitting: Cardiology

## 2021-02-10 ENCOUNTER — Other Ambulatory Visit: Payer: Self-pay

## 2021-02-10 ENCOUNTER — Ambulatory Visit (INDEPENDENT_AMBULATORY_CARE_PROVIDER_SITE_OTHER): Payer: 59 | Admitting: Cardiology

## 2021-02-10 VITALS — BP 160/80 | HR 62 | Ht 66.0 in | Wt 132.4 lb

## 2021-02-10 DIAGNOSIS — I10 Essential (primary) hypertension: Secondary | ICD-10-CM

## 2021-02-10 DIAGNOSIS — I2510 Atherosclerotic heart disease of native coronary artery without angina pectoris: Secondary | ICD-10-CM | POA: Diagnosis not present

## 2021-02-10 DIAGNOSIS — I739 Peripheral vascular disease, unspecified: Secondary | ICD-10-CM

## 2021-02-10 DIAGNOSIS — E785 Hyperlipidemia, unspecified: Secondary | ICD-10-CM

## 2021-02-10 NOTE — Patient Instructions (Signed)
Medication Instructions:  Your physician recommends that you continue on your current medications as directed. Please refer to the Current Medication list given to you today.  *If you need a refill on your cardiac medications before your next appointment, please call your pharmacy*   Lab Work: None If you have labs (blood work) drawn today and your tests are completely normal, you will receive your results only by: MyChart Message (if you have MyChart) OR A paper copy in the mail If you have any lab test that is abnormal or we need to change your treatment, we will call you to review the results.   Testing/Procedures: None   Follow-Up: At [facility], you and your health needs are our priority.  As part of our continuing mission to provide you with exceptional heart care, we have created designated Provider Care Teams.  These Care Teams include your primary Cardiologist (physician) and Advanced Practice Providers (APPs -  Physician Assistants and Nurse Practitioners) who all work together to provide you with the care you need, when you need it.  We recommend signing up for the patient portal called "MyChart".  Sign up information is provided on this After Visit Summary.  MyChart is used to connect with patients for Virtual Visits (Telemedicine).  Patients are able to view lab/test results, encounter notes, upcoming appointments, etc.  Non-urgent messages can be sent to your provider as well.   To learn more about what you can do with MyChart, go to [email or web address].    Your next appointment:   1 year(s)  The format for your next appointment:   In Person  Provider:   Norman Herrlich, MD   Other Instructions Please take your blood pressure daily for two weeks and drop off these readings at the office.

## 2021-02-10 NOTE — Progress Notes (Signed)
Cardiology Office Note:    Date:  02/10/2021   ID:  Jonathan Fleming, DOB 07-11-1961, MRN [id number]  PCP:  Olive Bass, MD  Cardiologist:  Norman Herrlich, MD    Referring MD: Olive Bass, MD    ASSESSMENT:    1. CAD in native artery   2. Hyperlipidemia, unspecified hyperlipidemia type   3. Primary hypertension   4. Peripheral vascular disease (HCC)    PLAN:    In order of problems listed above:  Stable CAD continue his long-term dual antiplatelet therapy therapy lipid-lowering with a high intensity statin and beta-blocker.  He is having no angina his most recent ejection fraction had normalized at this time I would not do an ischemia evaluation Continue his statin and his labs checked in his PCP office Trend blood pressures 2 weeks if systolics remain greater than 140 add calcium channel blocker He will continue to follow with vascular surgery may require intervention in the other extremity   Next appointment: 1 year   Medication Adjustments/Labs and Tests Ordered: Current medicines are reviewed at length with the patient today.  Concerns regarding medicines are outlined above.  No orders of the defined types were placed in this encounter.  No orders of the defined types were placed in this encounter.   Chief Complaint  Patient presents with   Follow-up   Coronary Artery Disease    History of Present Illness:    Jonathan Fleming is a 60 y.o. male with a hx of CAD, nonSTEMI in 1997 with PCI  Of LCF,  07/19/16 with PCI and DES to proximal LAD EF 40-45%, PAD and dyslipidemia  last seen 06/26/2019.  Stress echo 04/03/2018 showed no evidence of ischemia and his ejection fraction was normal at 60%.  He was last seen 11/11/2020.  Compliance with diet, lifestyle and medications: Yes  Unfortunately he continues to be limited with claudication now in the other lower extremity. No angina shortness of breath palpitation or syncope. Seen as a first patient he has  hypertension we discussed intensifying medical therapy or trending blood pressure for 2 weeks he prefers the latter and will check in the evenings after return from work will leave a list in 2 weeks if systolics remain above target would benefit from intensifying medical therapy.  He has been seen recently by vascular surgery for lower extremity claudication and had a recent angiography and PCI and stent left external iliac artery 02/05/2024.  On the right side his occlusion of the superficial femoral artery proximally and distally with three-vessel runoff. Past Medical History:  Diagnosis Date   Angina pectoris (HCC) 07/18/2016   Added automatically from request for surgery [id number]   CAD in native artery 02/21/2016   1997 DX/RX: Age 42. NSTEMI Inferior, Stent Circ x 2. MID LAD 70-80%  2009 Cardiolite Neg Coronary angiography 07/19/16: Conclusions Diagnostic Summary Culprit lesion is prox LAD--95%+ with TIMI II flow. Cx stent 60% in stent stenosis. Mid RCA has 65% lesion, R-L collaterals to LAD. LV gram shows moderate antero=apical hypokinesis--EF 40-45% Diagnostic Recommendations PCI recommended Interventional   Cholelithiasis 07/14/2016   Coronary artery disease    Current every day smoker 07/14/2016   Dyslipidemia 02/21/2016   Family history of premature coronary artery disease 02/07/2018   Parents, brothers, sisters very young   H/O heart artery stent 03/04/2017   Hyperlipidemia    Hypertension    LV dysfunction 04/03/2018   Myocardial infarction [facility])    age 42 sees [facility] in  [place], Stress test done at [facility], Dr. Benedetto Goad has records   Old MI (myocardial infarction) 03/04/2017   Peripheral vascular disease (HCC) 02/21/2016   2007 DX/RX: GEST (+) RLE claudication. s/p angioplasty Rt. Sup. Femoral artery   Prediabetes 02/21/2016   2018: 127/5.5  Formatting of this note might be different from the original. 2018: 127/5.5    Past Surgical History:  Procedure Laterality  Date   ABDOMINAL AORTOGRAM W/LOWER EXTREMITY Bilateral 02/05/2020   Procedure: ABDOMINAL AORTOGRAM W/LOWER EXTREMITY;  Surgeon: Chuck Hint, MD;  Location: [facility] INVASIVE CV LAB;  Service: Cardiovascular;  Laterality: Bilateral;   APPENDECTOMY     CORONARY ANGIOPLASTY     Stents x 2 age 89   LUMBAR LAMINECTOMY/DECOMPRESSION MICRODISCECTOMY  10/01/2011   Procedure: LUMBAR LAMINECTOMY/DECOMPRESSION MICRODISCECTOMY 1 LEVEL;  Surgeon: Cristi Loron, MD;  Location: [facility] NEURO ORS;  Service: Neurosurgery;  Laterality: Right;  Right Lumbar two-three discectomy   PERIPHERAL VASCULAR INTERVENTION Left 02/05/2020   Procedure: PERIPHERAL VASCULAR INTERVENTION;  Surgeon: Chuck Hint, MD;  Location: [facility] INVASIVE CV LAB;  Service: Cardiovascular;  Laterality: Left;  EXT ILIAC    Current Medications: Current Meds  Medication Sig   aspirin EC 81 MG tablet Take 1 tablet (81 mg total) by mouth daily.   clopidogrel (PLAVIX) 75 MG tablet TAKE 1 TABLET(75 MG) BY MOUTH DAILY   enalapril (VASOTEC) 10 MG tablet Take 10 mg by mouth 2 (two) times daily.   Glucosamine-Chondroit-Vit C-Mn (GLUCOSAMINE 1500 COMPLEX) CAPS Take 1 capsule by mouth daily.   loratadine (CLARITIN) 10 MG tablet Take 10 mg by mouth daily.   metoprolol tartrate (LOPRESSOR) 100 MG tablet Take 100 mg by mouth 2 (two) times daily.    Multiple Vitamin (MULITIVITAMIN WITH MINERALS) TABS Take 1 tablet by mouth daily.   nitroGLYCERIN (NITROSTAT) 0.4 MG SL tablet Place 1 tablet (0.4 mg total) under the tongue every 5 (five) minutes as needed for chest pain.   rosuvastatin (CRESTOR) 40 MG tablet Take 40 mg by mouth every evening.      Allergies:   Atorvastatin and Penicillins   Social History   Socioeconomic History   Marital status: Married    Spouse name: Not on file   Number of children: Not on file   Years of education: Not on file   Highest education level: Not on file  Occupational History   Not on file  Tobacco Use    Smoking status: Former    Packs/day: 0.50    Years: 20.00    Pack years: 10.00    Types: Cigarettes    Quit date: 06/23/2019    Years since quitting: 1.6   Smokeless tobacco: Never  Vaping Use   Vaping Use: Never used  Substance and Sexual Activity   Alcohol use: No   Drug use: No   Sexual activity: Not on file  Other Topics Concern   Not on file  Social History Narrative   Not on file   Social Determinants of Health   Financial Resource Strain: Not on file  Food Insecurity: Not on file  Transportation Needs: Not on file  Physical Activity: Not on file  Stress: Not on file  Social Connections: Not on file     Family History: The patient's family history includes Breast cancer in his sister; Coronary artery disease in his mother; Coronary artery disease (age of onset: 36) in his brother; Coronary artery disease (age of onset: 1) in his father; Coronary artery disease (age of onset:  29) in his brother; Diabetes (age of onset: 74) in his mother; Hypertension in his mother. There is no history of Anesthesia problems. ROS:   Please see the history of present illness.    All other systems reviewed and are negative.  EKGs/Labs/Other Studies Reviewed:    The following studies were reviewed today:   Recent Labs: Lipid profile 03/28/2020 cholesterol 109 LDL 47 triglycerides 155 HDL 47  Physical Exam:    VS:  BP (!) 160/80   Pulse 62   Ht 5\' 6"  (1.676 m)   Wt 132 lb 6.4 oz (60.1 kg)   SpO2 98%   BMI 21.37 kg/m     Wt Readings from Last 3 Encounters:  02/10/21 132 lb 6.4 oz (60.1 kg)  06/01/20 139 lb 8 oz (63.3 kg)  03/03/20 140 lb (63.5 kg)     GEN:  Well nourished, well developed in no acute distress HEENT: Normal NECK: No JVD; No carotid bruits LYMPHATICS: No lymphadenopathy CARDIAC: RRR, no murmurs, rubs, gallops RESPIRATORY:  Clear to auscultation without rales, wheezing or rhonchi  ABDOMEN: Soft, non-tender, non-distended MUSCULOSKELETAL:  No edema; No  deformity  SKIN: Warm and dry NEUROLOGIC:  Alert and oriented x 3 PSYCHIATRIC:  Normal affect    Signed, 03/05/20, MD  02/10/2021 8:15 AM    [facility]

## 2021-03-10 ENCOUNTER — Other Ambulatory Visit: Payer: Self-pay

## 2021-03-10 MED ORDER — AMLODIPINE BESYLATE 5 MG PO TABS: 5.0000 mg | ORAL_TABLET | Freq: Every day | ORAL | 3 refills | Status: DC

## 2021-03-10 NOTE — Progress Notes (Signed)
amlo

## 2021-05-12 ENCOUNTER — Other Ambulatory Visit: Payer: Self-pay | Admitting: Cardiology

## 2021-09-11 DIAGNOSIS — M79652 Pain in left thigh: Secondary | ICD-10-CM

## 2021-09-11 HISTORY — DX: Pain in left thigh: M79.652

## 2021-10-26 ENCOUNTER — Other Ambulatory Visit: Payer: Self-pay | Admitting: Cardiology

## 2022-02-23 ENCOUNTER — Ambulatory Visit: Payer: 59 | Admitting: Cardiology

## 2022-02-23 NOTE — Progress Notes (Deleted)
Cardiology Office Note:    Date:  02/23/2022   ID:  Jonathan Fleming, DOB [birth year], MRN [id number]  PCP:  Olive Bass, MD  Cardiologist:  Norman Herrlich, MD    Referring MD: Olive Bass, MD    ASSESSMENT:    No diagnosis found. PLAN:    In order of problems listed above:  ***   Next appointment: ***   Medication Adjustments/Labs and Tests Ordered: Current medicines are reviewed at length with the patient today.  Concerns regarding medicines are outlined above.  No orders of the defined types were placed in this encounter.  No orders of the defined types were placed in this encounter.   No chief complaint on file.   History of Present Illness:    Jonathan Fleming is a 61 y.o. male with a hx of CAD, nonSTEMI in 1997 with PCI  Of LCF,  07/19/16 with PCI and DES to proximal LAD EF 40-45%, PAD and dyslipidemia   last seen 02/10/2021.He is seen by vascular surgery for lower extremity claudication and had angiography and PCI and stent left external iliac artery 02/05/2020.  On the right side his occlusion of the superficial femoral artery proximally and distally with three-vessel runoff.  Compliance with diet, lifestyle and medications: *** Past Medical History:  Diagnosis Date   Angina pectoris (HCC) 07/18/2016   Added automatically from request for surgery [id number]   CAD in native artery 02/21/2016   1997 DX/RX: Age 89. NSTEMI Inferior, Stent Circ x 2. MID LAD 70-80%  2009 Cardiolite Neg Coronary angiography 07/19/16: Conclusions Diagnostic Summary Culprit lesion is prox LAD--95%+ with TIMI II flow. Cx stent 60% in stent stenosis. Mid RCA has 65% lesion, R-L collaterals to LAD. LV gram shows moderate antero=apical hypokinesis--EF 40-45% Diagnostic Recommendations PCI recommended Interventional   Cholelithiasis 07/14/2016   Coronary artery disease    Current every day smoker 07/14/2016   Dyslipidemia 02/21/2016   Family history of premature coronary artery disease  02/07/2018   Parents, brothers, sisters very young   H/O heart artery stent 03/04/2017   Hyperlipidemia    Hypertension    LV dysfunction 04/03/2018   Myocardial infarction [facility])    age 24 sees [facility] in [place], Stress test done at [facility], Dr. Benedetto Goad has records   Old MI (myocardial infarction) 03/04/2017   Peripheral vascular disease (HCC) 02/21/2016   2007 DX/RX: GEST (+) RLE claudication. s/p angioplasty Rt. Sup. Femoral artery   Prediabetes 02/21/2016   2018: 127/5.5  Formatting of this note might be different from the original. 2018: 127/5.5    Past Surgical History:  Procedure Laterality Date   ABDOMINAL AORTOGRAM W/LOWER EXTREMITY Bilateral 02/05/2020   Procedure: ABDOMINAL AORTOGRAM W/LOWER EXTREMITY;  Surgeon: Chuck Hint, MD;  Location: [facility] INVASIVE CV LAB;  Service: Cardiovascular;  Laterality: Bilateral;   APPENDECTOMY     CORONARY ANGIOPLASTY     Stents x 2 age 89   LUMBAR LAMINECTOMY/DECOMPRESSION MICRODISCECTOMY  10/01/2011   Procedure: LUMBAR LAMINECTOMY/DECOMPRESSION MICRODISCECTOMY 1 LEVEL;  Surgeon: Cristi Loron, MD;  Location: [facility] NEURO ORS;  Service: Neurosurgery;  Laterality: Right;  Right Lumbar two-three discectomy   PERIPHERAL VASCULAR INTERVENTION Left 02/05/2020   Procedure: PERIPHERAL VASCULAR INTERVENTION;  Surgeon: Chuck Hint, MD;  Location: [facility] INVASIVE CV LAB;  Service: Cardiovascular;  Laterality: Left;  EXT ILIAC    Current Medications: No outpatient medications have been marked as taking for the 02/23/22 encounter (Appointment) with Baldo Daub, MD.  Allergies:   Atorvastatin and Penicillins   Social History   Socioeconomic History   Marital status: Married    Spouse name: Not on file   Number of children: Not on file   Years of education: Not on file   Highest education level: Not on file  Occupational History   Not on file  Tobacco Use   Smoking status: Former    Packs/day: 0.50     Years: 20.00    Total pack years: 10.00    Types: Cigarettes    Quit date: 06/23/2019    Years since quitting: 2.6   Smokeless tobacco: Never  Vaping Use   Vaping Use: Never used  Substance and Sexual Activity   Alcohol use: No   Drug use: No   Sexual activity: Not on file  Other Topics Concern   Not on file  Social History Narrative   Not on file   Social Determinants of Health   Financial Resource Strain: Not on file  Food Insecurity: Not on file  Transportation Needs: Not on file  Physical Activity: Not on file  Stress: Not on file  Social Connections: Not on file     Family History: The patient's ***family history includes Breast cancer in his sister; Coronary artery disease in his mother; Coronary artery disease (age of onset: 9) in his brother; Coronary artery disease (age of onset: 65) in his father; Coronary artery disease (age of onset: 37) in his brother; Diabetes (age of onset: 45) in his mother; Hypertension in his mother. There is no history of Anesthesia problems. ROS:   Please see the history of present illness.    All other systems reviewed and are negative.  EKGs/Labs/Other Studies Reviewed:    The following studies were reviewed today:  EKG:  EKG ordered today and personally reviewed.  The ekg ordered today demonstrates ***  Recent Labs: No results found for requested labs within last 365 days.  Recent Lipid Panel No results found for: "CHOL", "TRIG", "HDL", "CHOLHDL", "VLDL", "LDLCALC", "LDLDIRECT"  Physical Exam:    VS:  There were no vitals taken for this visit.    Wt Readings from Last 3 Encounters:  02/10/21 132 lb 6.4 oz (60.1 kg)  06/01/20 139 lb 8 oz (63.3 kg)  03/03/20 140 lb (63.5 kg)     GEN: *** Well nourished, well developed in no acute distress HEENT: Normal NECK: No JVD; No carotid bruits LYMPHATICS: No lymphadenopathy CARDIAC: ***RRR, no murmurs, rubs, gallops RESPIRATORY:  Clear to auscultation without rales, wheezing or  rhonchi  ABDOMEN: Soft, non-tender, non-distended MUSCULOSKELETAL:  No edema; No deformity  SKIN: Warm and dry NEUROLOGIC:  Alert and oriented x 3 PSYCHIATRIC:  Normal affect    Signed, Norman Herrlich, MD  02/23/2022 7:22 AM    [facility]

## 2022-03-14 ENCOUNTER — Ambulatory Visit: Payer: 59 | Admitting: Cardiology

## 2022-03-14 ENCOUNTER — Encounter: Payer: Self-pay | Admitting: Cardiology

## 2022-03-14 VITALS — BP 152/80 | HR 54 | Ht 66.0 in | Wt 132.0 lb

## 2022-03-14 DIAGNOSIS — I739 Peripheral vascular disease, unspecified: Secondary | ICD-10-CM

## 2022-03-14 DIAGNOSIS — E782 Mixed hyperlipidemia: Secondary | ICD-10-CM | POA: Diagnosis not present

## 2022-03-14 DIAGNOSIS — I10 Essential (primary) hypertension: Secondary | ICD-10-CM | POA: Diagnosis not present

## 2022-03-14 DIAGNOSIS — I2510 Atherosclerotic heart disease of native coronary artery without angina pectoris: Secondary | ICD-10-CM

## 2022-03-14 NOTE — Patient Instructions (Addendum)
Medication Instructions:  Your physician recommends that you continue on your current medications as directed. Please refer to the Current Medication list given to you today.  *If you need a refill on your cardiac medications before your next appointment, please call your pharmacy*   Lab Work: Your physician recommends that you return for lab work in:   Labs today: CMP, Lipids (to be done at PCP office)  If you have labs (blood work) drawn today and your tests are completely normal, you will receive your results only by: MyChart Message (if you have MyChart) OR A paper copy in the mail If you have any lab test that is abnormal or we need to change your treatment, we will call you to review the results.   Testing/Procedures: None   Follow-Up: At [facility], you and your health needs are our priority.  As part of our continuing mission to provide you with exceptional heart care, we have created designated Provider Care Teams.  These Care Teams include your primary Cardiologist (physician) and Advanced Practice Providers (APPs -  Physician Assistants and Nurse Practitioners) who all work together to provide you with the care you need, when you need it.  We recommend signing up for the patient portal called "MyChart".  Sign up information is provided on this After Visit Summary.  MyChart is used to connect with patients for Virtual Visits (Telemedicine).  Patients are able to view lab/test results, encounter notes, upcoming appointments, etc.  Non-urgent messages can be sent to your provider as well.   To learn more about what you can do with MyChart, go to [email or web address].    Your next appointment:   1 year(s)  The format for your next appointment:   In Person  Provider:   Norman Herrlich, MD    Other Instructions Drop off list of blood pressures in 2 weeks.  Important Information About Sugar         [employer]beat  Tips to measure your blood pressure  correctly  To determine whether you have hypertension, a medical professional will take a blood pressure reading. How you prepare for the test, the position of your arm, and other factors can change a blood pressure reading by 10% or more. That could be enough to hide high blood pressure, start you on a drug you don't really need, or lead your doctor to incorrectly adjust your medications. National and international guidelines offer specific instructions for measuring blood pressure. If a doctor, nurse, or medical assistant isn't doing it right, don't hesitate to ask him or her to get with the guidelines. Here's what you can do to ensure a correct reading:  Don't drink a caffeinated beverage or smoke during the 30 minutes before the test.  Sit quietly for five minutes before the test begins.  During the measurement, sit in a chair with your feet on the floor and your arm supported so your elbow is at about heart level.  The inflatable part of the cuff should completely cover at least 80% of your upper arm, and the cuff should be placed on bare skin, not over a shirt.  Don't talk during the measurement.  Have your blood pressure measured twice, with a brief break in between. If the readings are different by 5 points or more, have it done a third time. There are times to break these rules. If you sometimes feel lightheaded when getting out of bed in the morning or when you stand after sitting, you should have your  blood pressure checked while seated and then while standing to see if it falls from one position to the next. Because blood pressure varies throughout the day, your doctor will rarely diagnose hypertension on the basis of a single reading. Instead, he or she will want to confirm the measurements on at least two occasions, usually within a few weeks of one another. The exception to this rule is if you have a blood pressure reading of 180/110 mm Hg or higher. A result this high usually calls for  prompt treatment. It's also a good idea to have your blood pressure measured in both arms at least once, since the reading in one arm (usually the right) may be higher than that in the left. A 2014 study in The American Journal of Medicine of nearly 3,400 people found average arm- to-arm differences in systolic blood pressure of about 5 points. The higher number should be used to make treatment decisions. In 2017, new guidelines from the American Heart Association, the [employer] of Cardiology, and nine other health organizations lowered the diagnosis of high blood pressure to 130/80 mm Hg or higher for all adults. The guidelines also redefined the various blood pressure categories to now include normal, elevated, Stage 1 hypertension, Stage 2 hypertension, and hypertensive crisis (see "Blood pressure categories"). Blood pressure categories  Blood pressure category SYSTOLIC (upper number)  DIASTOLIC (lower number)  Normal Less than 120 mm Hg and Less than 80 mm Hg  Elevated 120-129 mm Hg and Less than 80 mm Hg  High blood pressure: Stage 1 hypertension 130-139 mm Hg or 80-89 mm Hg  High blood pressure: Stage 2 hypertension 140 mm Hg or higher or 90 mm Hg or higher  Hypertensive crisis (consult your doctor immediately) Higher than 180 mm Hg and/or Higher than 120 mm Hg  Source: American Heart Association and American Stroke Association. For more on getting your blood pressure under control, buy Controlling Your Blood Pressure, a Special Health Report from [facility].

## 2022-03-14 NOTE — Progress Notes (Signed)
Cardiology Office Note:    Date:  03/14/2022   ID:  Jonathan Fleming, DOB 21-Apr-1961, MRN [id number]  PCP:  Jonathan Bass, MD  Cardiologist:  Jonathan Herrlich, MD    Referring MD: Jonathan Bass, MD    ASSESSMENT:    1. CAD in native artery   2. Mixed hyperlipidemia   3. Primary hypertension   4. Peripheral vascular disease (HCC)    PLAN:    In order of problems listed above:  Stable CAD having no angina with his peripheral arterial disease will remain on dual antiplatelet therapy long-term continue his beta-blocker and his high intensity statin.  He is at 1 year from last labs we will check a lipid profile and a CMP. Continue statin check lipid profile CMP today Trend blood pressures at home 2 weeks given education regarding good technique repeat blood pressure in the office 152/80 at rest is on a multidrug regimen including amlodipine enalapril and metoprolol. He declines vascular duplex and ABIs at this time city contact me if his symptoms worsen   Next appointment: 1 year   Medication Adjustments/Labs and Tests Ordered: Current medicines are reviewed at length with the patient today.  Concerns regarding medicines are outlined above.  No orders of the defined types were placed in this encounter.  No orders of the defined types were placed in this encounter.   Chief Complaint  Patient presents with   Annual Exam   Follow-up   Coronary Artery Disease    History of Present Illness:    Jonathan Fleming is a 61 y.o. male with a hx of CAD, nonSTEMI in 1997 with PCI  Of LCF,  07/19/16 with PCI and DES to proximal LAD EF 60% 2019, PAD and dyslipidemia  last seen 06/26/2019.  Stress echo 04/03/2018 showed no evidence of ischemia and his ejection fraction was normal at 60%.  He was last seen 02/10/2021 limited by lower extremity Claudication.  He has a history of right superficial femoral artery occlusion proximally with distal three-vessel runoff and has had PCI and stent left  external iliac artery to 02/05/2020  Lipid profile 03/30/2021 LDL 46 cholesterol 106 GFR was greater than 90 cc potassium 5.2 creatinine 0.96 and normal liver function test and CMP  He was seen at [facility] ED 06/11/2021 with complaints of left lower extremity pain and concerns of possible deep vein thrombosis.  He had x-rays of the extremity as well as venous duplex showing no findings of DVT. Compliance with diet, lifestyle and medications: Yes  He continues to be troubled by left lower extremity thigh pain some of it clearly is positional or he can curl up and he finds relief he has a history of disc disease with some of it clearly sounds like claudication and he has trouble ambulating and is limited to a couple 100 feet.  He said he has been seen by physical therapy and has a quadricep tendon injury.  I offered him vascular duplexes in my office and he wants to hold off for the time being  He is not having angina shortness of breath edema chest pain palpitation or syncope He tolerates his lipid-lowering therapy without muscle pain or weakness. He is not checking home blood pressure Past Medical History:  Diagnosis Date   Angina pectoris (HCC) 07/18/2016   Added automatically from request for surgery [id number]   CAD in native artery 02/21/2016   1997 DX/RX: Age 31. NSTEMI Inferior, Stent Circ x 2. MID LAD 70-80%  2009 Cardiolite Neg Coronary angiography 07/19/16: Conclusions Diagnostic Summary Culprit lesion is prox LAD--95%+ with TIMI II flow. Cx stent 60% in stent stenosis. Mid RCA has 65% lesion, R-L collaterals to LAD. LV gram shows moderate antero=apical hypokinesis--EF 40-45% Diagnostic Recommendations PCI recommended Interventional   Cholelithiasis 07/14/2016   Coronary artery disease    Current every day smoker 07/14/2016   Dyslipidemia 02/21/2016   Family history of premature coronary artery disease 02/07/2018   Parents, brothers, sisters very young   H/O heart artery stent 03/04/2017    Hyperlipidemia    Hypertension    LV dysfunction 04/03/2018   Myocardial infarction [facility])    age 41 sees [facility] in [place], Stress test done at [facility], Dr. Benedetto Goad has records   Old MI (myocardial infarction) 03/04/2017   Peripheral vascular disease (HCC) 02/21/2016   2007 DX/RX: GEST (+) RLE claudication. s/p angioplasty Rt. Sup. Femoral artery   Prediabetes 02/21/2016   2018: 127/5.5  Formatting of this note might be different from the original. 2018: 127/5.5    Past Surgical History:  Procedure Laterality Date   ABDOMINAL AORTOGRAM W/LOWER EXTREMITY Bilateral 02/05/2020   Procedure: ABDOMINAL AORTOGRAM W/LOWER EXTREMITY;  Surgeon: Chuck Hint, MD;  Location: [facility] INVASIVE CV LAB;  Service: Cardiovascular;  Laterality: Bilateral;   APPENDECTOMY     CORONARY ANGIOPLASTY     Stents x 2 age 51   LUMBAR LAMINECTOMY/DECOMPRESSION MICRODISCECTOMY  10/01/2011   Procedure: LUMBAR LAMINECTOMY/DECOMPRESSION MICRODISCECTOMY 1 LEVEL;  Surgeon: Cristi Loron, MD;  Location: [facility] NEURO ORS;  Service: Neurosurgery;  Laterality: Right;  Right Lumbar two-three discectomy   PERIPHERAL VASCULAR INTERVENTION Left 02/05/2020   Procedure: PERIPHERAL VASCULAR INTERVENTION;  Surgeon: Chuck Hint, MD;  Location: [facility] INVASIVE CV LAB;  Service: Cardiovascular;  Laterality: Left;  EXT ILIAC    Current Medications: Current Meds  Medication Sig   amLODipine (NORVASC) 5 MG tablet Take 1 tablet (5 mg total) by mouth daily.   aspirin EC 81 MG tablet Take 1 tablet (81 mg total) by mouth daily.   clopidogrel (PLAVIX) 75 MG tablet TAKE 1 TABLET(75 MG) BY MOUTH DAILY   enalapril (VASOTEC) 10 MG tablet Take 10 mg by mouth 2 (two) times daily.   Glucosamine-Chondroit-Vit C-Mn (GLUCOSAMINE 1500 COMPLEX) CAPS Take 1 capsule by mouth daily.   loratadine (CLARITIN) 10 MG tablet Take 10 mg by mouth daily.   metoprolol tartrate (LOPRESSOR) 100 MG tablet Take 100 mg by mouth 2 (two)  times daily.    Multiple Vitamin (MULITIVITAMIN WITH MINERALS) TABS Take 1 tablet by mouth daily.   nitroGLYCERIN (NITROSTAT) 0.4 MG SL tablet Place 1 tablet (0.4 mg total) under the tongue every 5 (five) minutes as needed for chest pain.   rosuvastatin (CRESTOR) 40 MG tablet Take 40 mg by mouth every evening.      Allergies:   Atorvastatin and Penicillins   Social History   Socioeconomic History   Marital status: Married    Spouse name: Not on file   Number of children: Not on file   Years of education: Not on file   Highest education level: Not on file  Occupational History   Not on file  Tobacco Use   Smoking status: Former    Packs/day: 0.50    Years: 20.00    Total pack years: 10.00    Types: Cigarettes    Quit date: 06/23/2019    Years since quitting: 2.7   Smokeless tobacco: Never  Vaping Use   Vaping  Use: Never used  Substance and Sexual Activity   Alcohol use: No   Drug use: No   Sexual activity: Not on file  Other Topics Concern   Not on file  Social History Narrative   Not on file   Social Determinants of Health   Financial Resource Strain: Not on file  Food Insecurity: Not on file  Transportation Needs: Not on file  Physical Activity: Not on file  Stress: Not on file  Social Connections: Not on file     Family History: The patient's family history includes Breast cancer in his sister; Coronary artery disease in his mother; Coronary artery disease (age of onset: 64) in his brother; Coronary artery disease (age of onset: 35) in his father; Coronary artery disease (age of onset: 71) in his brother; Diabetes (age of onset: 68) in his mother; Hypertension in his mother. There is no history of Anesthesia problems. ROS:   Please see the history of present illness.    All other systems reviewed and are negative.  EKGs/Labs/Other Studies Reviewed:    The following studies were reviewed today:  EKG:  EKG ordered today and personally reviewed.  The ekg  ordered today demonstrates sinus rhythm 54 bpm old septal infarction without acute ischemic changes unchanged from 07/13/2019    Physical Exam:    VS:  BP (!) 180/90 (BP Location: Right Arm, Patient Position: Sitting, Cuff Size: Normal)   Pulse (!) 54   Ht 5\' 6"  (1.676 m)   Wt 132 lb (59.9 kg)   SpO2 98%   BMI 21.31 kg/m     Wt Readings from Last 3 Encounters:  03/14/22 132 lb (59.9 kg)  02/10/21 132 lb 6.4 oz (60.1 kg)  06/01/20 139 lb 8 oz (63.3 kg)     GEN:  Well nourished, well developed in no acute distress HEENT: Normal NECK: No JVD; No carotid bruits LYMPHATICS: No lymphadenopathy CARDIAC: RRR, no murmurs, rubs, gallops RESPIRATORY:  Clear to auscultation without rales, wheezing or rhonchi  ABDOMEN: Soft, non-tender, non-distended MUSCULOSKELETAL:  No edema; No deformity  SKIN: Warm and dry NEUROLOGIC:  Alert and oriented x 3 PSYCHIATRIC:  Normal affect    Signed, 06/03/20, MD  03/14/2022 8:09 AM    [facility]

## 2022-03-17 ENCOUNTER — Other Ambulatory Visit: Payer: Self-pay | Admitting: Cardiology

## 2022-05-16 ENCOUNTER — Other Ambulatory Visit: Payer: Self-pay | Admitting: Cardiology

## 2022-05-16 NOTE — Telephone Encounter (Signed)
Refill to pharmacy 

## 2022-12-12 ENCOUNTER — Other Ambulatory Visit: Payer: Self-pay | Admitting: Cardiology

## 2022-12-12 NOTE — Telephone Encounter (Signed)
Refills to pharmacy 

## 2023-01-07 NOTE — Progress Notes (Unsigned)
Cardiology Office Note:    Date:  01/08/2023   ID:  Jonathan Fleming, DOB [birth year], MRN [id number]  PCP:  Olive Bass, MD  Cardiologist:  Norman Herrlich, MD    Referring MD: Olive Bass, MD    ASSESSMENT:    1. CAD in native artery   2. Mixed hyperlipidemia   3. Primary hypertension   4. Peripheral vascular disease (HCC)   5. Syncope and collapse    PLAN:    In order of problems listed above:  Continues to do well with CAD having no anginal discomfort after multiple PCI continue his chronic dual antiplatelet therapy with PAD along with his beta-blocker and lipid-lowering rosuvastatin.  Follow-up labs in August PCP office and I think he benefit from being screened for Lpa fortunately no longer smoking. Stable claudication should benefit if he can stop vaping from his cigarette smoking cessation EKG in the office today is normal unless he had recurrent episode of bleeding place monitors at this time but asked him to trend his home blood pressures if he frequently got recording plus to reduce his calcium channel blocker   Next appointment: 1 year   Medication Adjustments/Labs and Tests Ordered: Current medicines are reviewed at length with the patient today.  Concerns regarding medicines are outlined above.  No orders of the defined types were placed in this encounter.  No orders of the defined types were placed in this encounter.   Chief Complaint  Patient presents with   Follow-up   Coronary Artery Disease    History of Present Illness:    Jonathan Fleming is a 62 y.o. male with a hx of CAD with non-ST elevation MI 1997 PCI and stent left circumflex coronary artery PCI and stent proximal LAD December 2017 peripheral arterial disease and dyslipidemia he has a history of right superficial femoral artery long leg occlusion in the right lower extremity and has had PCI and stent of the left external iliac artery in 2021 last seen 03/14/2022.  Compliance with diet,  lifestyle and medications: Yes  Overall is done well he stopped smoking a few months ago but he is vaping at this time He has had no and discomfort should this of breath palpitation. He continues to have bilateral Claudication stable pattern Few months ago he had a severe episode of leg cramps got up went into the bathroom standing up felt lightheaded and had a syncopal episode. Has had no recurrent syncope since that time Profile 03/30/2022 had an LDL of 47 non-HDL cholesterol 69 total cholesterol 107 triglycerides 139 HDL 38 Repeat labs in August wellness exam with his PCP Home blood pressure runs 120s to 140 systolic  Past Medical History:  Diagnosis Date   Angina pectoris (HCC) 07/18/2016   Added automatically from request for surgery [id number]   CAD in native artery 02/21/2016   1997 DX/RX: Age 12. NSTEMI Inferior, Stent Circ x 2. MID LAD 70-80%  2009 Cardiolite Neg Coronary angiography 07/19/16: Conclusions Diagnostic Summary Culprit lesion is prox LAD--95%+ with TIMI II flow. Cx stent 60% in stent stenosis. Mid RCA has 65% lesion, R-L collaterals to LAD. LV gram shows moderate antero=apical hypokinesis--EF 40-45% Diagnostic Recommendations PCI recommended Interventional   Cholelithiasis 07/14/2016   Current every day smoker 07/14/2016   Dyslipidemia 02/21/2016   Family history of premature coronary artery disease 02/07/2018   Parents, brothers, sisters very young   H/O heart artery stent 03/04/2017   History of angina pectoris 07/18/2016   Formatting of  this note might be different from the original.  Added automatically from request for surgery [id number]   Hyperlipidemia    Hypertension    LV dysfunction 04/03/2018   Old MI (myocardial infarction) 03/04/2017   Pain of left thigh 09/11/2021   Formatting of this note might be different from the original.  05/2021: ED  09/11/2021 : normal exam   Peripheral vascular disease (HCC) 02/21/2016   2007 DX/RX: GEST (+) RLE claudication. s/p  angioplasty Rt. Sup. Femoral artery   Prediabetes 02/21/2016   2018: 127/5.5  Formatting of this note might be different from the original. 2018: 127/5.5   Screening for colon cancer 01/22/2018   Formatting of this note might be different from the original.  03/30/2021: confirmed, reviewed options and sx to watch for    Past Surgical History:  Procedure Laterality Date   ABDOMINAL AORTOGRAM W/LOWER EXTREMITY Bilateral 02/05/2020   Procedure: ABDOMINAL AORTOGRAM W/LOWER EXTREMITY;  Surgeon: Chuck Hint, MD;  Location[facility] INVASIVE CV LAB;  Service: Cardiovascular;  Laterality: Bilateral;   APPENDECTOMY     CORONARY ANGIOPLASTY     Stents x 2 age 63   LUMBAR LAMINECTOMY/DECOMPRESSION MICRODISCECTOMY  10/01/2011   Procedure: LUMBAR LAMINECTOMY/DECOMPRESSION MICRODISCECTOMY 1 LEVEL;  Surgeon: Cristi Loron, MD;  Location: [facility] NEURO ORS;  Service: Neurosurgery;  Laterality: Right;  Right Lumbar two-three discectomy   PERIPHERAL VASCULAR INTERVENTION Left 02/05/2020   Procedure: PERIPHERAL VASCULAR INTERVENTION;  Surgeon: Chuck Hint, MD;  Location: [facility] INVASIVE CV LAB;  Service: Cardiovascular;  Laterality: Left;  EXT ILIAC    Current Medications: Current Meds  Medication Sig   amLODipine (NORVASC) 5 MG tablet TAKE 1 TABLET BY MOUTH DAILY   aspirin EC 81 MG tablet Take 1 tablet (81 mg total) by mouth daily.   clopidogrel (PLAVIX) 75 MG tablet TAKE 1 TABLET(75 MG) BY MOUTH DAILY   enalapril (VASOTEC) 10 MG tablet Take 10 mg by mouth 2 (two) times daily.   Glucosamine-Chondroit-Vit C-Mn (GLUCOSAMINE 1500 COMPLEX) CAPS Take 1 capsule by mouth daily.   loratadine (CLARITIN) 10 MG tablet Take 10 mg by mouth daily.   metoprolol tartrate (LOPRESSOR) 100 MG tablet Take 100 mg by mouth 2 (two) times daily.    Multiple Vitamin (MULITIVITAMIN WITH MINERALS) TABS Take 1 tablet by mouth daily.   nitroGLYCERIN (NITROSTAT) 0.4 MG SL tablet Place 1 tablet (0.4 mg total) under the tongue  every 5 (five) minutes as needed for chest pain.   rosuvastatin (CRESTOR) 40 MG tablet Take 40 mg by mouth every evening.      Allergies:   Atorvastatin and Penicillins   Social History   Socioeconomic History   Marital status: Married    Spouse name: Not on file   Number of children: Not on file   Years of education: Not on file   Highest education level: Not on file  Occupational History   Not on file  Tobacco Use   Smoking status: Former    Packs/day: 0.50    Years: 20.00    Additional pack years: 0.00    Total pack years: 10.00    Types: Cigarettes    Quit date: 06/23/2019    Years since quitting: 3.5   Smokeless tobacco: Never  Vaping Use   Vaping Use: Never used  Substance and Sexual Activity   Alcohol use: No   Drug use: No   Sexual activity: Not on file  Other Topics Concern   Not on file  Social History Narrative  Not on file   Social Determinants of Health   Financial Resource Strain: Not on file  Food Insecurity: Not on file  Transportation Needs: Not on file  Physical Activity: Not on file  Stress: Not on file  Social Connections: Not on file     Family History: The patient's family history includes Breast cancer in his sister; Coronary artery disease in his mother; Coronary artery disease (age of onset: 19) in his brother; Coronary artery disease (age of onset: 70) in his father; Coronary artery disease (age of onset: 38) in his brother; Diabetes (age of onset: 43) in his mother; Hypertension in his mother. There is no history of Anesthesia problems. ROS:   Please see the history of present illness.    All other systems reviewed and are negative.  EKGs/Labs/Other Studies Reviewed:    The following studies were reviewed today:  Cardiac Studies & Procedures     STRESS TESTS  ECHOCARDIOGRAM STRESS TEST 04/11/2018  Narrative *[facility]* [address] [place], [place]  [address] [phone]  ------------------------------------------------------------------- Stress Echocardiography  Patient:    Massi, Shute MR #:       [id number] Study Date: 04/11/2018 Gender:     M Age:        57 Height:     165.1 cm Weight:     69 kg BSA:        1.79 m^2 Pt. Status: Room:  ATTENDING    Norman Herrlich, MD ORDERING     Norman Herrlich, MD REFERRING    Norman Herrlich, MD SONOGRAPHER  Lanae Crumbly, RDCS PERFORMING   [facility] [place]  cc:  -------------------------------------------------------------------  ------------------------------------------------------------------- Indications:      Chest pain 786.51.  ------------------------------------------------------------------- History:   PMH:  No prior cardiac history.  Coronary artery disease.  ------------------------------------------------------------------- Study Conclusions  - Stress ECG conclusions: There were no stress arrhythmias or conduction abnormalities. The stress ECG was negative for ischemia. - Staged echo: There was no echocardiographic evidence for stress-induced ischemia.  Impressions:  - 1. Stress echo inconclusive. Patient achieved submaximal heart rate requested the test to be terminated because of claudication pain in his lower extremities. 2. The stress echo images at rest and stress were within normal limits. There was good augmentation and wall motion at the peak of exercise. 3. Fair exercise capacity. There were no significant other symptoms or EKG changes.  ------------------------------------------------------------------- Study data:   Study status:  Routine.  Consent:  The risks, benefits, and alternatives to the procedure were explained to the patient and informed consent was obtained.  Procedure:  The patient reported no pain pre or post test. Initial setup. The patient was brought to the laboratory. A baseline ECG was recorded. Surface ECG leads and automatic cuff  blood pressure measurements were monitored. Treadmill exercise testing was performed using the Bruce protocol. The patient exercised for 6 min 24 sec, to protocol stage 2, to a maximal work rate of 7 mets. Exercise was terminated due to moderate fatigue. The patient was positioned for image acquisition and recovery monitoring. Transthoracic stress echocardiography. Image quality was adequate. Images were captured at baseline and peak exercise.  Study completion:  The patient tolerated the procedure well. There were no complications.          Bruce protocol. Stress echocardiography.  Birthdate:  Patient birthdate: [birth year].  Age:  Patient is 62 yr old.  Sex:  Gender: male. BMI: 25.3 kg/m^2.  Blood pressure:     126/76  Patient  status: Outpatient.  Study date:  Study date: 04/11/2018. Study time: 02:13 PM.  -------------------------------------------------------------------  ------------------------------------------------------------------- Baseline ECG:  Normal.  ------------------------------------------------------------------- Stress protocol:  +--------+---------------+---+-------------+--------+ !Stage   !Time into phase!HR !BP (mmHg)    !Symptoms! +--------+---------------+---+-------------+--------+ !Baseline!---------------!61 !127/78 (94)  !None    ! +--------+---------------+---+-------------+--------+ !Stage 1 !---------------!92 !-------------!--------! +--------+---------------+---+-------------+--------+ !Stage 2 !---------------!122!196/106 (136)!--------! +--------+---------------+---+-------------+--------+ !Recovery!24 min 29 sec  !63 !174/91 (119) !--------! +--------+---------------+---+-------------+--------+  ------------------------------------------------------------------- Stress results:   Maximal heart rate during stress was 122 bpm (74% of maximal predicted heart rate). The maximal predicted heart rate was 164 bpm.The target heart rate was achieved.  The heart rate response to stress was normal. There was a normal resting blood pressure with an appropriate response to stress. The rate-pressure product for the peak heart rate and blood pressure was 16109 mm Hg/min.  The patient experienced no chest pain during stress.  ------------------------------------------------------------------- Stress ECG:  There were no stress arrhythmias or conduction abnormalities.  The stress ECG was negative for ischemia.  ------------------------------------------------------------------- Baseline:  - The estimated LV ejection fraction was 60%. - Normal wall motion; no LV regional wall motion abnormalities.  Peak stress:  - The estimated LV ejection fraction was 70%. - Normal wall motion; no LV regional wall motion abnormalities.  ------------------------------------------------------------------- Stress echo results:     Left ventricular ejection fraction was normal at rest and with stress. There was no echocardiographic evidence for stress-induced ischemia.  ------------------------------------------------------------------- Measurements  Left ventricle                         Value        Reference LV ID, ED, PLAX chordal        (L)     40    mm     43 - 52 LV ID, ES, PLAX chordal                26    mm     23 - 38 LV fx shortening, PLAX chordal         35    %      >=29 LV PW thickness, ED                    9     mm     --------- IVS/LV PW ratio, ED                    1            <=1.3  Ventricular septum                     Value        Reference IVS thickness, ED                      9     mm     ---------  Aorta                                  Value        Reference Aortic root ID, ED                     29    mm     ---------  Left atrium  Value        Reference LA ID, A-P, ES                         34    mm     --------- LA ID/bsa, A-P                         1.9   cm/m^2 <=2.2  Tricuspid valve                         Value        Reference Tricuspid regurg peak velocity         244   cm/s   --------- Tricuspid peak RV-RA gradient          24    mm Hg  ---------  Legend: (L)  and  (H)  mark values outside specified reference range.  ------------------------------------------------------------------- Prepared and Electronically Authenticated by  Belva Crome, MD 2019-08-30T16:53:11              EKG:  EKG ordered today and personally reviewed.  The ekg ordered today demonstrates sinus rhythm QS 73 consider old anterior septal MI unchanged from previous    Physical Exam:    VS:  BP (!) 108/52   Pulse 72   Ht 5\' 5"  (1.651 m)   Wt 141 lb 3.2 oz (64 kg)   SpO2 95%   BMI 23.50 kg/m     Wt Readings from Last 3 Encounters:  01/08/23 141 lb 3.2 oz (64 kg)  03/14/22 132 lb (59.9 kg)  02/10/21 132 lb 6.4 oz (60.1 kg)     GEN:  Well nourished, well developed in no acute distress HEENT: Normal NECK: No JVD; No carotid bruits LYMPHATICS: No lymphadenopathy CARDIAC: RRR, no murmurs, rubs, gallops RESPIRATORY:  Clear to auscultation without rales, wheezing or rhonchi  ABDOMEN: Soft, non-tender, non-distended MUSCULOSKELETAL:  No edema; No deformity  SKIN: Warm and dry NEUROLOGIC:  Alert and oriented x 3 PSYCHIATRIC:  Normal affect    Signed, Norman Herrlich, MD  01/08/2023 4:08 PM    [facility]

## 2023-01-08 ENCOUNTER — Ambulatory Visit: Payer: 59 | Attending: Cardiology | Admitting: Cardiology

## 2023-01-08 ENCOUNTER — Encounter: Payer: Self-pay | Admitting: Cardiology

## 2023-01-08 VITALS — BP 108/52 | HR 72 | Ht 65.0 in | Wt 141.2 lb

## 2023-01-08 DIAGNOSIS — I2510 Atherosclerotic heart disease of native coronary artery without angina pectoris: Secondary | ICD-10-CM

## 2023-01-08 DIAGNOSIS — I739 Peripheral vascular disease, unspecified: Secondary | ICD-10-CM | POA: Diagnosis not present

## 2023-01-08 DIAGNOSIS — I10 Essential (primary) hypertension: Secondary | ICD-10-CM | POA: Diagnosis not present

## 2023-01-08 DIAGNOSIS — E782 Mixed hyperlipidemia: Secondary | ICD-10-CM

## 2023-01-08 DIAGNOSIS — R55 Syncope and collapse: Secondary | ICD-10-CM

## 2023-01-08 NOTE — Patient Instructions (Signed)
Medication Instructions:  Your physician recommends that you continue on your current medications as directed. Please refer to the Current Medication list given to you today.  *If you need a refill on your cardiac medications before your next appointment, please call your pharmacy*   Lab Work: None If you have labs (blood work) drawn today and your tests are completely normal, you will receive your results only by: MyChart Message (if you have MyChart) OR A paper copy in the mail If you have any lab test that is abnormal or we need to change your treatment, we will call you to review the results.   Testing/Procedures: None   Follow-Up: At [facility], you and your health needs are our priority.  As part of our continuing mission to provide you with exceptional heart care, we have created designated Provider Care Teams.  These Care Teams include your primary Cardiologist (physician) and Advanced Practice Providers (APPs -  Physician Assistants and Nurse Practitioners) who all work together to provide you with the care you need, when you need it.  We recommend signing up for the patient portal called "MyChart".  Sign up information is provided on this After Visit Summary.  MyChart is used to connect with patients for Virtual Visits (Telemedicine).  Patients are able to view lab/test results, encounter notes, upcoming appointments, etc.  Non-urgent messages can be sent to your provider as well.   To learn more about what you can do with MyChart, go to [email or web address].    Your next appointment:   1 year(s)  Provider:   Brian Munley, MD    Other Instructions None  

## 2023-08-02 ENCOUNTER — Other Ambulatory Visit: Payer: Self-pay | Admitting: Cardiology

## 2023-10-04 ENCOUNTER — Other Ambulatory Visit: Payer: Self-pay | Admitting: *Deleted

## 2023-10-04 DIAGNOSIS — I739 Peripheral vascular disease, unspecified: Secondary | ICD-10-CM

## 2023-10-10 ENCOUNTER — Ambulatory Visit (HOSPITAL_COMMUNITY)
Admission: RE | Admit: 2023-10-10 | Discharge: 2023-10-10 | Disposition: A | Discharge status: Home / Self Care | Payer: Commercial Managed Care - HMO | Source: Ambulatory Visit | Attending: Vascular Surgery | Admitting: Vascular Surgery

## 2023-10-10 DIAGNOSIS — I739 Peripheral vascular disease, unspecified: Secondary | ICD-10-CM | POA: Diagnosis present

## 2023-10-10 LAB — VAS US ABI WITH/WO TBI
Left ABI: 0.61
Right ABI: 0.76

## 2023-10-10 NOTE — Progress Notes (Unsigned)
 Patient ID: Jonathan Fleming, male   DOB: [birth year], 63 y.o.   MRN: [id number]  Reason for Consult: No chief complaint on file.   Referred by Olive Bass, MD  Subjective:     HPI  Jonathan Fleming is a 63 y.o. male with a history of PAD and a left external iliac stent that was performed in June 2021 by Dr. Durwin Nora for claudication.  She was seen in follow-up in October 2021 and the stent was noted to have a significant in-stent restenosis with velocities in the high 600s and angiography was recommended although he declined due to insurance and medical bills/financial issues. Today he reports ***  Past Medical History:  Diagnosis Date   Angina pectoris (HCC) 07/18/2016   Added automatically from request for surgery [id number]   CAD in native artery 02/21/2016   1997 DX/RX: Age 49. NSTEMI Inferior, Stent Circ x 2. MID LAD 70-80%  2009 Cardiolite Neg Coronary angiography 07/19/16: Conclusions Diagnostic Summary Culprit lesion is prox LAD--95%+ with TIMI II flow. Cx stent 60% in stent stenosis. Mid RCA has 65% lesion, R-L collaterals to LAD. LV gram shows moderate antero=apical hypokinesis--EF 40-45% Diagnostic Recommendations PCI recommended Interventional   Cholelithiasis 07/14/2016   Current every day smoker 07/14/2016   Dyslipidemia 02/21/2016   Family history of premature coronary artery disease 02/07/2018   Parents, brothers, sisters very young   H/O heart artery stent 03/04/2017   History of angina pectoris 07/18/2016   Formatting of this note might be different from the original.  Added automatically from request for surgery [id number]   Hyperlipidemia    Hypertension    LV dysfunction 04/03/2018   Old MI (myocardial infarction) 03/04/2017   Pain of left thigh 09/11/2021   Formatting of this note might be different from the original.  05/2021: ED  09/11/2021 : normal exam   Peripheral vascular disease (HCC) 02/21/2016   2007 DX/RX: GEST (+) RLE claudication. s/p angioplasty Rt.  Sup. Femoral artery   Prediabetes 02/21/2016   2018: 127/5.5  Formatting of this note might be different from the original. 2018: 127/5.5   Screening for colon cancer 01/22/2018   Formatting of this note might be different from the original.  03/30/2021: confirmed, reviewed options and sx to watch for   Family History  Problem Relation Age of Onset   Diabetes Mother 46   Coronary artery disease Mother    Hypertension Mother    Coronary artery disease Father 6   Breast cancer Sister    Coronary artery disease Brother 21   Coronary artery disease Brother 44   Anesthesia problems Neg Hx    Past Surgical History:  Procedure Laterality Date   ABDOMINAL AORTOGRAM W/LOWER EXTREMITY Bilateral 02/05/2020   Procedure: ABDOMINAL AORTOGRAM W/LOWER EXTREMITY;  Surgeon: Chuck Hint, MD;  Location: [facility] INVASIVE CV LAB;  Service: Cardiovascular;  Laterality: Bilateral;   APPENDECTOMY     CORONARY ANGIOPLASTY     Stents x 2 age 78   LUMBAR LAMINECTOMY/DECOMPRESSION MICRODISCECTOMY  10/01/2011   Procedure: LUMBAR LAMINECTOMY/DECOMPRESSION MICRODISCECTOMY 1 LEVEL;  Surgeon: Cristi Loron, MD;  Location: [facility];  Service: Neurosurgery;  Laterality: Right;  Right Lumbar two-three discectomy   PERIPHERAL VASCULAR INTERVENTION Left 02/05/2020   Procedure: PERIPHERAL VASCULAR INTERVENTION;  Surgeon: Chuck Hint, MD;  Location: [facility] INVASIVE CV LAB;  Service: Cardiovascular;  Laterality: Left;  EXT ILIAC    Short Social History:  Social History   Tobacco Use   Smoking status:  Former    Current packs/day: 0.00    Average packs/day: 0.5 packs/day for 20.0 years (10.0 ttl pk-yrs)    Types: Cigarettes    Start date: 06/23/1999    Quit date: 06/23/2019    Years since quitting: 4.3   Smokeless tobacco: Never  Substance Use Topics   Alcohol use: No    Allergies  Allergen Reactions   Atorvastatin Other (See Comments)    Increased LFT Weakness per patient    Penicillins  Nausea And Vomiting    Current Outpatient Medications  Medication Sig Dispense Refill   amLODipine (NORVASC) 5 MG tablet TAKE 1 TABLET BY MOUTH EVERY DAY 180 tablet 2   aspirin EC 81 MG tablet Take 1 tablet (81 mg total) by mouth daily. 90 tablet 3   clopidogrel (PLAVIX) 75 MG tablet TAKE 1 TABLET(75 MG) BY MOUTH DAILY 90 tablet 2   enalapril (VASOTEC) 10 MG tablet Take 10 mg by mouth 2 (two) times daily.     Glucosamine-Chondroit-Vit C-Mn (GLUCOSAMINE 1500 COMPLEX) CAPS Take 1 capsule by mouth daily.     loratadine (CLARITIN) 10 MG tablet Take 10 mg by mouth daily.     metoprolol tartrate (LOPRESSOR) 100 MG tablet Take 100 mg by mouth 2 (two) times daily.      Multiple Vitamin (MULITIVITAMIN WITH MINERALS) TABS Take 1 tablet by mouth daily.     nitroGLYCERIN (NITROSTAT) 0.4 MG SL tablet Place 1 tablet (0.4 mg total) under the tongue every 5 (five) minutes as needed for chest pain. 25 tablet 12   rosuvastatin (CRESTOR) 40 MG tablet Take 40 mg by mouth every evening.      No current facility-administered medications for this visit.    REVIEW OF SYSTEMS  Positive for ***  All other systems were reviewed and are negative     Objective:  Objective   There were no vitals filed for this visit. There is no height or weight on file to calculate BMI.  Physical Exam General: no acute distress Cardiac: hemodynamically stable Pulm: normal work of breathing Abdomen: non-tender, no pulsatile mass*** Neuro: alert, no focal deficit Extremities: no edema, cyanosis or wounds*** Vascular:   Right: ***  Left: ***   Data: ABI +---------+------------------+-----+----------+--------+  Right   Rt Pressure (mmHg)IndexWaveform  Comment   +---------+------------------+-----+----------+--------+  Brachial 122                                        +---------+------------------+-----+----------+--------+  PTA     86                0.70 monophasic           +---------+------------------+-----+----------+--------+  DP      94                0.76 monophasic          +---------+------------------+-----+----------+--------+  Great Toe49                0.40 Abnormal            +---------+------------------+-----+----------+--------+   +---------+------------------+-----+----------+-------+  Left    Lt Pressure (mmHg)IndexWaveform  Comment  +---------+------------------+-----+----------+-------+  Brachial 123                                       +---------+------------------+-----+----------+-------+  PTA  72                0.59 monophasic         +---------+------------------+-----+----------+-------+  DP      75                0.61 monophasic         +---------+------------------+-----+----------+-------+  Great Toe46                0.37 Abnormal           +---------+------------------+-----+----------+-------+   +-------+-----------+-----------+------------+------------+  ABI/TBIToday's ABIToday's TBIPrevious ABIPrevious TBI  +-------+-----------+-----------+------------+------------+  Right 0.76       0.40       0.68        0.48          +-------+-----------+-----------+------------+------------+  Left  0.61       0.37       0.84        0.69          +-------+-----------+-----------+------------+------------+   ABI from 2021 Right 0.68 Left 0.84  Aortoiliac duplex from 2021 Left Stent(s):  +---------------+---------+--------------+----------+----------------------  ----+  Prox to Stent  156                    monophasicstenosis                     +---------------+---------+--------------+----------+----------------------  ----+  Proximal Stent 552      50-99%        monophasicstenotic                                             stenosis                                              +---------------+---------+--------------+----------+----------------------  ----+  Mid Stent      693      50-99%        monophasicprx to mid stent,  stenotic                         stenosis                                             +---------------+---------+--------------+----------+----------------------  ----+  Distal Stent   246 / 65150-99%        monophasic                                                     stenosis                                             +---------------+---------+--------------+----------+----------------------  ----+  Distal to [id number]                    monophasic                             +---------------+---------+--------------+----------+----------------------  Assessment/Plan:     Jonathan Fleming is a 63 y.o. male with history of PAD and a left external iliac stent placed for claudication in 2021.  He is known to have severe in-stent stenosis on the left side which is now likely to be occluded given his 0.2 drop in his ABI from 2021 to now with a nonpalpable femoral pulse ***.  Today he reports symptoms of *** I recommended angiogram with possible recanalization of his left external iliac stent    Recommendations to optimize cardiovascular risk: Abstinence from all tobacco products. Blood glucose control with goal A1c < 7%. Blood pressure control with goal blood pressure < 140/90 mmHg. Lipid reduction therapy with goal LDL-C <100 mg/dL  Aspirin 81mg  PO QD.  Atorvastatin 40-80mg  PO QD (or other "high intensity" statin therapy).     Daria Pastures MD [facility]

## 2023-10-10 NOTE — H&P (View-Only) (Signed)
 Patient ID: Jonathan Fleming, male   DOB: [birth year], 63 y.o.   MRN: [id number]  Reason for Consult: No chief complaint on file.   Referred by Olive Bass, MD  Subjective:     HPI  Jonathan Fleming is a 63 y.o. male with a history of PAD and a left external iliac stent that was performed in June 2021 by Dr. Durwin Nora for claudication.  She was seen in follow-up in October 2021 and the stent was noted to have a significant in-stent restenosis with velocities in the high 600s and angiography was recommended although he declined due to insurance and medical bills/financial issues. Today he reports ***  Past Medical History:  Diagnosis Date   Angina pectoris (HCC) 07/18/2016   Added automatically from request for surgery [id number]   CAD in native artery 02/21/2016   1997 DX/RX: Age 49. NSTEMI Inferior, Stent Circ x 2. MID LAD 70-80%  2009 Cardiolite Neg Coronary angiography 07/19/16: Conclusions Diagnostic Summary Culprit lesion is prox LAD--95%+ with TIMI II flow. Cx stent 60% in stent stenosis. Mid RCA has 65% lesion, R-L collaterals to LAD. LV gram shows moderate antero=apical hypokinesis--EF 40-45% Diagnostic Recommendations PCI recommended Interventional   Cholelithiasis 07/14/2016   Current every day smoker 07/14/2016   Dyslipidemia 02/21/2016   Family history of premature coronary artery disease 02/07/2018   Parents, brothers, sisters very young   H/O heart artery stent 03/04/2017   History of angina pectoris 07/18/2016   Formatting of this note might be different from the original.  Added automatically from request for surgery [id number]   Hyperlipidemia    Hypertension    LV dysfunction 04/03/2018   Old MI (myocardial infarction) 03/04/2017   Pain of left thigh 09/11/2021   Formatting of this note might be different from the original.  05/2021: ED  09/11/2021 : normal exam   Peripheral vascular disease (HCC) 02/21/2016   2007 DX/RX: GEST (+) RLE claudication. s/p angioplasty Rt.  Sup. Femoral artery   Prediabetes 02/21/2016   2018: 127/5.5  Formatting of this note might be different from the original. 2018: 127/5.5   Screening for colon cancer 01/22/2018   Formatting of this note might be different from the original.  03/30/2021: confirmed, reviewed options and sx to watch for   Family History  Problem Relation Age of Onset   Diabetes Mother 46   Coronary artery disease Mother    Hypertension Mother    Coronary artery disease Father 6   Breast cancer Sister    Coronary artery disease Brother 21   Coronary artery disease Brother 44   Anesthesia problems Neg Hx    Past Surgical History:  Procedure Laterality Date   ABDOMINAL AORTOGRAM W/LOWER EXTREMITY Bilateral 02/05/2020   Procedure: ABDOMINAL AORTOGRAM W/LOWER EXTREMITY;  Surgeon: Chuck Hint, MD;  Location: [facility] INVASIVE CV LAB;  Service: Cardiovascular;  Laterality: Bilateral;   APPENDECTOMY     CORONARY ANGIOPLASTY     Stents x 2 age 78   LUMBAR LAMINECTOMY/DECOMPRESSION MICRODISCECTOMY  10/01/2011   Procedure: LUMBAR LAMINECTOMY/DECOMPRESSION MICRODISCECTOMY 1 LEVEL;  Surgeon: Cristi Loron, MD;  Location: [facility];  Service: Neurosurgery;  Laterality: Right;  Right Lumbar two-three discectomy   PERIPHERAL VASCULAR INTERVENTION Left 02/05/2020   Procedure: PERIPHERAL VASCULAR INTERVENTION;  Surgeon: Chuck Hint, MD;  Location: [facility] INVASIVE CV LAB;  Service: Cardiovascular;  Laterality: Left;  EXT ILIAC    Short Social History:  Social History   Tobacco Use   Smoking status:  Former    Current packs/day: 0.00    Average packs/day: 0.5 packs/day for 20.0 years (10.0 ttl pk-yrs)    Types: Cigarettes    Start date: 06/23/1999    Quit date: 06/23/2019    Years since quitting: 4.3   Smokeless tobacco: Never  Substance Use Topics   Alcohol use: No    Allergies  Allergen Reactions   Atorvastatin Other (See Comments)    Increased LFT Weakness per patient    Penicillins  Nausea And Vomiting    Current Outpatient Medications  Medication Sig Dispense Refill   amLODipine (NORVASC) 5 MG tablet TAKE 1 TABLET BY MOUTH EVERY DAY 180 tablet 2   aspirin EC 81 MG tablet Take 1 tablet (81 mg total) by mouth daily. 90 tablet 3   clopidogrel (PLAVIX) 75 MG tablet TAKE 1 TABLET(75 MG) BY MOUTH DAILY 90 tablet 2   enalapril (VASOTEC) 10 MG tablet Take 10 mg by mouth 2 (two) times daily.     Glucosamine-Chondroit-Vit C-Mn (GLUCOSAMINE 1500 COMPLEX) CAPS Take 1 capsule by mouth daily.     loratadine (CLARITIN) 10 MG tablet Take 10 mg by mouth daily.     metoprolol tartrate (LOPRESSOR) 100 MG tablet Take 100 mg by mouth 2 (two) times daily.      Multiple Vitamin (MULITIVITAMIN WITH MINERALS) TABS Take 1 tablet by mouth daily.     nitroGLYCERIN (NITROSTAT) 0.4 MG SL tablet Place 1 tablet (0.4 mg total) under the tongue every 5 (five) minutes as needed for chest pain. 25 tablet 12   rosuvastatin (CRESTOR) 40 MG tablet Take 40 mg by mouth every evening.      No current facility-administered medications for this visit.    REVIEW OF SYSTEMS  Positive for ***  All other systems were reviewed and are negative     Objective:  Objective   There were no vitals filed for this visit. There is no height or weight on file to calculate BMI.  Physical Exam General: no acute distress Cardiac: hemodynamically stable Pulm: normal work of breathing Abdomen: non-tender, no pulsatile mass*** Neuro: alert, no focal deficit Extremities: no edema, cyanosis or wounds*** Vascular:   Right: ***  Left: ***   Data: ABI +---------+------------------+-----+----------+--------+  Right   Rt Pressure (mmHg)IndexWaveform  Comment   +---------+------------------+-----+----------+--------+  Brachial 122                                        +---------+------------------+-----+----------+--------+  PTA     86                0.70 monophasic           +---------+------------------+-----+----------+--------+  DP      94                0.76 monophasic          +---------+------------------+-----+----------+--------+  Great Toe49                0.40 Abnormal            +---------+------------------+-----+----------+--------+   +---------+------------------+-----+----------+-------+  Left    Lt Pressure (mmHg)IndexWaveform  Comment  +---------+------------------+-----+----------+-------+  Brachial 123                                       +---------+------------------+-----+----------+-------+  PTA  72                0.59 monophasic         +---------+------------------+-----+----------+-------+  DP      75                0.61 monophasic         +---------+------------------+-----+----------+-------+  Great Toe46                0.37 Abnormal           +---------+------------------+-----+----------+-------+   +-------+-----------+-----------+------------+------------+  ABI/TBIToday's ABIToday's TBIPrevious ABIPrevious TBI  +-------+-----------+-----------+------------+------------+  Right 0.76       0.40       0.68        0.48          +-------+-----------+-----------+------------+------------+  Left  0.61       0.37       0.84        0.69          +-------+-----------+-----------+------------+------------+   ABI from 2021 Right 0.68 Left 0.84  Aortoiliac duplex from 2021 Left Stent(s):  +---------------+---------+--------------+----------+----------------------  ----+  Prox to Stent  156                    monophasicstenosis                     +---------------+---------+--------------+----------+----------------------  ----+  Proximal Stent 552      50-99%        monophasicstenotic                                             stenosis                                              +---------------+---------+--------------+----------+----------------------  ----+  Mid Stent      693      50-99%        monophasicprx to mid stent,  stenotic                         stenosis                                             +---------------+---------+--------------+----------+----------------------  ----+  Distal Stent   246 / 65150-99%        monophasic                                                     stenosis                                             +---------------+---------+--------------+----------+----------------------  ----+  Distal to [id number]                    monophasic                             +---------------+---------+--------------+----------+----------------------  Assessment/Plan:     Jonathan Fleming is a 63 y.o. male with history of PAD and a left external iliac stent placed for claudication in 2021.  He is known to have severe in-stent stenosis on the left side which is now likely to be occluded given his 0.2 drop in his ABI from 2021 to now with a nonpalpable femoral pulse ***.  Today he reports symptoms of *** I recommended angiogram with possible recanalization of his left external iliac stent    Recommendations to optimize cardiovascular risk: Abstinence from all tobacco products. Blood glucose control with goal A1c < 7%. Blood pressure control with goal blood pressure < 140/90 mmHg. Lipid reduction therapy with goal LDL-C <100 mg/dL  Aspirin 81mg  PO QD.  Atorvastatin 40-80mg  PO QD (or other "high intensity" statin therapy).     Daria Pastures MD [facility]

## 2023-10-11 ENCOUNTER — Ambulatory Visit: Payer: Commercial Managed Care - HMO | Admitting: Vascular Surgery

## 2023-10-11 ENCOUNTER — Encounter: Payer: Self-pay | Admitting: Vascular Surgery

## 2023-10-11 VITALS — BP 133/74 | HR 68 | Temp 98.0°F | Resp 20 | Ht 65.0 in | Wt 142.3 lb

## 2023-10-11 DIAGNOSIS — I70222 Atherosclerosis of native arteries of extremities with rest pain, left leg: Secondary | ICD-10-CM | POA: Diagnosis not present

## 2023-10-11 MED ORDER — TRAMADOL HCL 50 MG PO TABS: 50.0000 mg | ORAL_TABLET | Freq: Four times a day (QID) | ORAL | 0 refills | Status: AC | PRN

## 2023-10-14 ENCOUNTER — Other Ambulatory Visit: Payer: Self-pay

## 2023-10-14 DIAGNOSIS — I70222 Atherosclerosis of native arteries of extremities with rest pain, left leg: Secondary | ICD-10-CM

## 2023-10-21 ENCOUNTER — Other Ambulatory Visit: Payer: Self-pay

## 2023-10-21 ENCOUNTER — Encounter (HOSPITAL_COMMUNITY)
Admission: RE | Disposition: A | Discharge status: Home / Self Care | Payer: Self-pay | Source: Home / Self Care | Attending: Vascular Surgery

## 2023-10-21 ENCOUNTER — Ambulatory Visit (HOSPITAL_COMMUNITY)
Admission: RE | Admit: 2023-10-21 | Discharge: 2023-10-21 | Disposition: A | Discharge status: Home / Self Care | Attending: Vascular Surgery | Admitting: Vascular Surgery

## 2023-10-21 DIAGNOSIS — Z87891 Personal history of nicotine dependence: Secondary | ICD-10-CM | POA: Diagnosis not present

## 2023-10-21 DIAGNOSIS — Z79899 Other long term (current) drug therapy: Secondary | ICD-10-CM | POA: Insufficient documentation

## 2023-10-21 DIAGNOSIS — I70222 Atherosclerosis of native arteries of extremities with rest pain, left leg: Secondary | ICD-10-CM | POA: Diagnosis present

## 2023-10-21 DIAGNOSIS — Z7982 Long term (current) use of aspirin: Secondary | ICD-10-CM | POA: Insufficient documentation

## 2023-10-21 HISTORY — PX: ABDOMINAL AORTOGRAM W/LOWER EXTREMITY: CATH118223

## 2023-10-21 HISTORY — PX: LOWER EXTREMITY INTERVENTION: CATH118252

## 2023-10-21 LAB — POCT I-STAT, CHEM 8
BUN: 11 mg/dL (ref 8–23)
Calcium, Ion: 1.34 mmol/L (ref 1.15–1.40)
Chloride: 104 mmol/L (ref 98–111)
Creatinine, Ser: 1.1 mg/dL (ref 0.61–1.24)
Glucose, Bld: 128 mg/dL — ABNORMAL HIGH (ref 70–99)
HCT: 40 % (ref 39.0–52.0)
Hemoglobin: 13.6 g/dL (ref 13.0–17.0)
Potassium: 4.9 mmol/L (ref 3.5–5.1)
Sodium: 140 mmol/L (ref 135–145)
TCO2: 27 mmol/L (ref 22–32)

## 2023-10-21 SURGERY — ABDOMINAL AORTOGRAM W/LOWER EXTREMITY
Anesthesia: LOCAL

## 2023-10-21 MED ORDER — HEPARIN (PORCINE) IN NACL 1000-0.9 UT/500ML-% IV SOLN
INTRAVENOUS | Status: DC | PRN
  Administered 2023-10-21 (×2): 500 mL

## 2023-10-21 MED ORDER — LABETALOL HCL 5 MG/ML IV SOLN: 10.0000 mg | INTRAVENOUS | Status: DC | PRN

## 2023-10-21 MED ORDER — LIDOCAINE HCL (PF) 1 % IJ SOLN
INTRAMUSCULAR | Status: DC | PRN
Start: 2023-10-21 — End: 2023-10-21
  Administered 2023-10-21: 15 mL

## 2023-10-21 MED ORDER — SODIUM CHLORIDE 0.9 % IV SOLN: INTRAVENOUS | Status: DC

## 2023-10-21 MED ORDER — FENTANYL CITRATE (PF) 100 MCG/2ML IJ SOLN
INTRAMUSCULAR | Status: AC
  Filled 2023-10-21: qty 2

## 2023-10-21 MED ORDER — ACETAMINOPHEN 325 MG PO TABS: 650.0000 mg | ORAL_TABLET | ORAL | Status: DC | PRN

## 2023-10-21 MED ORDER — LIDOCAINE HCL (PF) 1 % IJ SOLN
INTRAMUSCULAR | Status: AC
  Filled 2023-10-21: qty 30

## 2023-10-21 MED ORDER — HYDRALAZINE HCL 20 MG/ML IJ SOLN: 5.0000 mg | INTRAMUSCULAR | Status: DC | PRN

## 2023-10-21 MED ORDER — MIDAZOLAM HCL 2 MG/2ML IJ SOLN
INTRAMUSCULAR | Status: DC | PRN
  Administered 2023-10-21: 1 mg via INTRAVENOUS

## 2023-10-21 MED ORDER — HEPARIN SODIUM (PORCINE) 1000 UNIT/ML IJ SOLN
INTRAMUSCULAR | Status: DC | PRN
Start: 2023-10-21 — End: 2023-10-21
  Administered 2023-10-21: 6000 [IU] via INTRAVENOUS

## 2023-10-21 MED ORDER — SODIUM CHLORIDE 0.9% FLUSH: 3.0000 mL | Freq: Two times a day (BID) | INTRAVENOUS | Status: DC

## 2023-10-21 MED ORDER — FENTANYL CITRATE (PF) 100 MCG/2ML IJ SOLN
INTRAMUSCULAR | Status: DC | PRN
  Administered 2023-10-21: 50 ug via INTRAVENOUS

## 2023-10-21 MED ORDER — SODIUM CHLORIDE 0.9 % WEIGHT BASED INFUSION: 1.0000 mL/kg/h | INTRAVENOUS | Status: DC

## 2023-10-21 MED ORDER — CLOPIDOGREL BISULFATE 300 MG PO TABS
ORAL_TABLET | ORAL | Status: AC
  Filled 2023-10-21: qty 1

## 2023-10-21 MED ORDER — ONDANSETRON HCL 4 MG/2ML IJ SOLN: 4.0000 mg | Freq: Four times a day (QID) | INTRAMUSCULAR | Status: DC | PRN

## 2023-10-21 MED ORDER — SODIUM CHLORIDE 0.9 % IV SOLN: 250.0000 mL | INTRAVENOUS | Status: DC | PRN

## 2023-10-21 MED ORDER — HEPARIN SODIUM (PORCINE) 1000 UNIT/ML IJ SOLN
INTRAMUSCULAR | Status: AC
  Filled 2023-10-21: qty 10

## 2023-10-21 MED ORDER — SODIUM CHLORIDE 0.9% FLUSH: 3.0000 mL | INTRAVENOUS | Status: DC | PRN

## 2023-10-21 MED ORDER — MIDAZOLAM HCL 2 MG/2ML IJ SOLN
INTRAMUSCULAR | Status: AC
  Filled 2023-10-21: qty 2

## 2023-10-21 MED ORDER — CLOPIDOGREL BISULFATE 300 MG PO TABS
ORAL_TABLET | ORAL | Status: DC | PRN
  Administered 2023-10-21: 300 mg via ORAL

## 2023-10-21 SURGICAL SUPPLY — 16 items
CATH OMNI FLUSH 5F 65CM (CATHETERS) IMPLANT
CATH QUICKCROSS SUPP .035X90CM (MICROCATHETER) IMPLANT
DCB RANGER 6.0X40 135 (BALLOONS) IMPLANT
GLIDEWIRE ADV .035X260CM (WIRE) IMPLANT
KIT ENCORE 26 ADVANTAGE (KITS) IMPLANT
KIT MICROPUNCTURE NIT STIFF (SHEATH) IMPLANT
KIT SINGLE USE MANIFOLD (KITS) IMPLANT
RANGER DCB 6.0X40 135 (BALLOONS) ×2 IMPLANT
SET ATX-X65L (MISCELLANEOUS) IMPLANT
SHEATH CATAPULT 6F 45 MP (SHEATH) IMPLANT
SHEATH PINNACLE 5F 10CM (SHEATH) IMPLANT
SHEATH PINNACLE 6F 10CM (SHEATH) IMPLANT
STENT VIABAHN 6X50X120 (Permanent Stent) IMPLANT
TRAY PV CATH (CUSTOM PROCEDURE TRAY) ×2 IMPLANT
WIRE BENTSON .035X145CM (WIRE) IMPLANT
WIRE G V18X300CM (WIRE) IMPLANT

## 2023-10-21 NOTE — Op Note (Signed)
 Patient name: Jonathan Fleming MRN: [id number] DOB: April 28, 1961 Sex: male  10/21/2023 Pre-operative Diagnosis: CL TI with left leg rest pain and life limiting claudication Post-operative diagnosis:  Same Surgeon:  Daria Pastures, MD Procedure Performed:  Ultrasound-guided access of right common femoral artery Aortogram and left lower extremity angiogram Third order cannulation of left popliteal artery Drug-coated balloon angioplasty of left popliteal with 6 x 40 Ranger Stenting of left external iliac with 6 mm x 5 cm Viabahn Mynx closure of right common femoral artery 43 minutes moderate sedation, fentanyl and Versed   Indications: Patient is a 63 year old male with PAD and history of a left external iliac stent (6 x 30) performed in June 2021 by Dr. Durwin Nora.  His 1 month follow-up he was found to have restenosis with high velocities in the 600s although he declined intervention due to financial issues.  He presented to clinic after being lost to follow-up and reported significant lifestyle limiting claudication as well as severe rest pain at night that caused him issues sleeping.  Angiogram with intervention was offered, risk and benefits reviewed, he expressed understanding elected to proceed.  Findings:  Aorta and bilateral renal arteries widely patent.  Bilateral common iliac arteries diseased but without stenosis.  Bilateral internal iliacs occluded.  Left external iliac stent with severe greater than 80% stenosis at the proximal edge.  Left common femoral, profunda and SFA widely patent.  Severe greater than 80% stenosis at P1 with a widely patent below-knee popliteal and three-vessel runoff via the AT, PT and peroneal.   Procedure:  The patient was identified in the holding area and taken to the cath lab  The patient was then placed supine on the table and prepped and draped in the usual sterile fashion.  A time out was called.  Ultrasound was used to evaluate the right common femoral  artery.  It was patent .  A digital ultrasound image was acquired.  A micropuncture needle was used to access the right common femoral artery under ultrasound guidance.  An 018 wire was advanced without resistance and a micropuncture sheath was placed.  The 018 wire was removed and a benson wire was placed.  The micropuncture sheath was exchanged for a 5 french sheath.  An omniflush catheter was advanced over the wire to the level of L-1.  An abdominal angiogram was obtained.  Next, using the omniflush catheter and a glide advantage wire, the aortic bifurcation was crossed and the catheter was placed into theleft external iliac artery and left runoff was obtained. This demonstrated the above findings.  The glide advantage wire was replaced through the catheter into the SFA.  The patient was attempted heparinized and the short 6 French sheath was exchanged for a 6 [place] by 45 cm catapult sheath.  The popliteal lesion was crossed with the glide advantage wire and quick cross catheter.  An angiogram via the catheter demonstrated true lumen crossing.  A V18 wire was placed through the catheter and this lesion was then treated with a 6 x 40 Ranger DCB.  A 6 mm x 5 cm Viabahn was advanced to the level of the previous placed stent in the sheath was pulled back.  The Viabahn stent was deployed with the distal edges and the distal external iliac matched up in the proximal edge extending about 2 cm proximal from the previously placed stent.  This was postdilated with a 6 mm balloon.  Completion angiography demonstrated wide patency of the stent with  brisk flow and an excellent response of the popliteal lesion with less than 20% residual stenosis.  There was preserved runoff with much improved velocity through the tibials.  The glide advantage wire was then replaced through the sheath and the long 6 French sheath was exchanged for a short 6 [place] sheath.  A minx closure device was then applied with excellent  hemostasis.  Contrast: 55 cc Sedation: 43 minutes  Impression: Maximally revascularized with relining of the previous left external iliac stent and excellent response of the popliteal lesion with DCB.  Inline flow to the foot with three-vessel runoff.   Daria Pastures MD [facility] [place] Office: [phone]

## 2023-10-21 NOTE — Interval H&P Note (Signed)
 History and Physical Interval Note:  10/21/2023 10:50 AM  Jonathan Fleming [facility]  has presented today for surgery, with the diagnosis of ischemia left lower extremity w/ rest pain   I70.222.  The various methods of treatment have been discussed with the patient and family. After consideration of risks, benefits and other options for treatment, the patient has consented to  Procedure(s): ABDOMINAL AORTOGRAM W/LOWER EXTREMITY (N/A) as a surgical intervention.  The patient's history has been reviewed, patient examined, no change in status, stable for surgery.  I have reviewed the patient's chart and labs.  Questions were answered to the patient's satisfaction.     Daria Pastures

## 2023-10-22 ENCOUNTER — Encounter (HOSPITAL_COMMUNITY): Payer: Self-pay | Admitting: Vascular Surgery

## 2023-12-11 ENCOUNTER — Other Ambulatory Visit: Payer: Self-pay

## 2023-12-11 DIAGNOSIS — I739 Peripheral vascular disease, unspecified: Secondary | ICD-10-CM

## 2023-12-13 ENCOUNTER — Ambulatory Visit (HOSPITAL_COMMUNITY)
Admission: RE | Admit: 2023-12-13 | Discharge: 2023-12-13 | Disposition: A | Discharge status: Home / Self Care | Source: Ambulatory Visit | Attending: Vascular Surgery | Admitting: Vascular Surgery

## 2023-12-13 ENCOUNTER — Ambulatory Visit (HOSPITAL_BASED_OUTPATIENT_CLINIC_OR_DEPARTMENT_OTHER)
Admission: RE | Admit: 2023-12-13 | Discharge: 2023-12-13 | Disposition: A | Discharge status: Home / Self Care | Source: Ambulatory Visit | Attending: Vascular Surgery | Admitting: Vascular Surgery

## 2023-12-13 ENCOUNTER — Ambulatory Visit: Attending: Vascular Surgery | Admitting: Physician Assistant

## 2023-12-13 VITALS — BP 138/97 | HR 51 | Temp 98.4°F | Wt 145.8 lb

## 2023-12-13 DIAGNOSIS — I70222 Atherosclerosis of native arteries of extremities with rest pain, left leg: Secondary | ICD-10-CM | POA: Diagnosis not present

## 2023-12-13 DIAGNOSIS — I739 Peripheral vascular disease, unspecified: Secondary | ICD-10-CM | POA: Insufficient documentation

## 2023-12-13 LAB — VAS US ABI WITH/WO TBI
Left ABI: 0.91
Right ABI: 0.63

## 2023-12-13 NOTE — Progress Notes (Signed)
 Office Note     CC:  follow up Requesting Provider:  Madlyn Schirmer, MD  HPI: Jonathan Fleming is a 63 y.o. ([birth year]) male who presents for surveillance follow up of PAD. He has history of left external iliac artery stenting by Dr. Shaunna Delaware in June of 2021. This was for LLE claudication. Following his stent he had some recurrent in stent restenosis with claudication but had declined intervention. However when he was seen for follow up with Dr. Susi Eric in February of this year he had progressed to having rest pain and was agreeable to intervention. On 10/21/23 he underwent Aortogram, arteriogram of LLE with DCB angioplasty of his left popliteal artery and stenting of his left EIA by Dr. Susi Eric. Noted to have three vessel runoff.   Today he reports he is doing great. His rest pain is now resolved. Denies any pain on ambulation or rest. No tissue loss. He says he got a flat tire yesterday and was straining a lot to get the lug nuts off and noticed late yesterday that he had a lot of swelling in his left ankle. No swelling on the right. This has not improved much over night. It is not painful. He is medically managed on Aspirin , Plavix , and statin. He is not diabetic. He takes CCB, ACE, HCTZ for hypertension. Former smoker quit in May of 2024.  Past Medical History:  Diagnosis Date   Angina pectoris (HCC) 07/18/2016   Added automatically from request for surgery [id number]   CAD in native artery 02/21/2016   1997 DX/RX: Age 40. NSTEMI Inferior, Stent Circ x 2. MID LAD 70-80%  2009 Cardiolite Neg Coronary angiography 07/19/16: Conclusions Diagnostic Summary Culprit lesion is prox LAD--95%+ with TIMI II flow. Cx stent 60% in stent stenosis. Mid RCA has 65% lesion, R-L collaterals to LAD. LV gram shows moderate antero=apical hypokinesis--EF 40-45% Diagnostic Recommendations PCI recommended Interventional   Cholelithiasis 07/14/2016   Current every day smoker 07/14/2016   Dyslipidemia 02/21/2016    Family history of premature coronary artery disease 02/07/2018   Parents, brothers, sisters very young   H/O heart artery stent 03/04/2017   History of angina pectoris 07/18/2016   Formatting of this note might be different from the original.  Added automatically from request for surgery [id number]   Hyperlipidemia    Hypertension    LV dysfunction 04/03/2018   Old MI (myocardial infarction) 03/04/2017   Pain of left thigh 09/11/2021   Formatting of this note might be different from the original.  05/2021: ED  09/11/2021 : normal exam   Peripheral vascular disease (HCC) 02/21/2016   2007 DX/RX: GEST (+) RLE claudication. s/p angioplasty Rt. Sup. Femoral artery   Prediabetes 02/21/2016   2018: 127/5.5  Formatting of this note might be different from the original. 2018: 127/5.5   Screening for colon cancer 01/22/2018   Formatting of this note might be different from the original.  03/30/2021: confirmed, reviewed options and sx to watch for    Past Surgical History:  Procedure Laterality Date   ABDOMINAL AORTOGRAM W/LOWER EXTREMITY Bilateral 02/05/2020   Procedure: ABDOMINAL AORTOGRAM W/LOWER EXTREMITY;  Surgeon: Dannis Dy, MD;  Location: [facility] INVASIVE CV LAB;  Service: Cardiovascular;  Laterality: Bilateral;   ABDOMINAL AORTOGRAM W/LOWER EXTREMITY N/A 10/21/2023   Procedure: ABDOMINAL AORTOGRAM W/LOWER EXTREMITY;  Surgeon: Philipp Brawn, MD;  Location: [facility] INVASIVE CV LAB;  Service: Cardiovascular;  Laterality: N/A;   APPENDECTOMY     CORONARY ANGIOPLASTY     Stents x  2 age 11   LOWER EXTREMITY INTERVENTION Left 10/21/2023   Procedure: LOWER EXTREMITY INTERVENTION;  Surgeon: Philipp Brawn, MD;  Location: [facility] INVASIVE CV LAB;  Service: Cardiovascular;  Laterality: Left;  SFA - DCB, [place] - Stent   LUMBAR LAMINECTOMY/DECOMPRESSION MICRODISCECTOMY  10/01/2011   Procedure: LUMBAR LAMINECTOMY/DECOMPRESSION MICRODISCECTOMY 1 LEVEL;  Surgeon: Elder Greening, MD;  Location: [facility] NEURO  ORS;  Service: Neurosurgery;  Laterality: Right;  Right Lumbar two-three discectomy   PERIPHERAL VASCULAR INTERVENTION Left 02/05/2020   Procedure: PERIPHERAL VASCULAR INTERVENTION;  Surgeon: Dannis Dy, MD;  Location: [facility] INVASIVE CV LAB;  Service: Cardiovascular;  Laterality: Left;  EXT ILIAC    Social History   Socioeconomic History   Marital status: Married    Spouse name: Not on file   Number of children: Not on file   Years of education: Not on file   Highest education level: Not on file  Occupational History   Not on file  Tobacco Use   Smoking status: Former    Current packs/day: 0.00    Average packs/day: 0.5 packs/day for 20.0 years (10.0 ttl pk-yrs)    Types: Cigarettes    Start date: 06/23/1999    Quit date: 06/23/2019    Years since quitting: 4.4   Smokeless tobacco: Never  Vaping Use   Vaping status: Never Used  Substance and Sexual Activity   Alcohol use: No   Drug use: No   Sexual activity: Not on file  Other Topics Concern   Not on file  Social History Narrative   Not on file   Social Drivers of Health   Financial Resource Strain: Not on file  Food Insecurity: Low Risk  (09/30/2023)   Received from [facility]   Hunger Vital Sign    Worried About Running Out of Food in the Last Year: Never true    Ran Out of Food in the Last Year: Never true  Transportation Needs: No Transportation Needs (09/30/2023)   Received from [facility]    In the past 12 months, has lack of reliable transportation kept you from medical appointments, meetings, work or from getting things needed for daily living? : No  Physical Activity: Not on file  Stress: Not on file  Social Connections: Not on file  Intimate Partner Violence: Not on file    Family History  Problem Relation Age of Onset   Diabetes Mother 104   Coronary artery disease Mother    Hypertension Mother    Coronary artery disease Father 24   Breast cancer Sister    Coronary  artery disease Brother 20   Coronary artery disease Brother 44   Anesthesia problems Neg Hx     Current Outpatient Medications  Medication Sig Dispense Refill   amLODipine  (NORVASC ) 5 MG tablet TAKE 1 TABLET BY MOUTH EVERY DAY 180 tablet 2   aspirin  EC 81 MG tablet Take 1 tablet (81 mg total) by mouth daily. 90 tablet 3   clonazePAM (KLONOPIN) 0.5 MG tablet Take 0.5 mg by mouth at bedtime.     clopidogrel  (PLAVIX ) 75 MG tablet TAKE 1 TABLET(75 MG) BY MOUTH DAILY 90 tablet 2   enalapril  (VASOTEC ) 10 MG tablet Take 10 mg by mouth 2 (two) times daily.     hydrochlorothiazide (MICROZIDE) 12.5 MG capsule Take 12.5 mg by mouth daily.     metoprolol  tartrate (LOPRESSOR ) 100 MG tablet Take 100 mg by mouth 2 (two) times daily.  Multiple Vitamin (MULITIVITAMIN WITH MINERALS) TABS Take 1 tablet by mouth daily.     nitroGLYCERIN  (NITROSTAT ) 0.4 MG SL tablet Place 1 tablet (0.4 mg total) under the tongue every 5 (five) minutes as needed for chest pain. 25 tablet 12   rosuvastatin  (CRESTOR ) 40 MG tablet Take 40 mg by mouth every evening.      traMADol  (ULTRAM ) 50 MG tablet Take 1 tablet (50 mg total) by mouth every 6 (six) hours as needed. 20 tablet 0   No current facility-administered medications for this visit.    Allergies  Allergen Reactions   Atorvastatin Other (See Comments)    Increased LFT Weakness per patient    Penicillins Nausea And Vomiting     REVIEW OF SYSTEMS:  [X]  denotes positive finding, [ ]  denotes negative finding Cardiac  Comments:  Chest pain or chest pressure:    Shortness of breath upon exertion:    Short of breath when lying flat:    Irregular heart rhythm:        Vascular    Pain in calf, thigh, or hip brought on by ambulation:    Pain in feet at night that wakes you up from your sleep:     Blood clot in your veins:    Leg swelling:  X Left ankle      Pulmonary    Oxygen at home:    Productive cough:     Wheezing:         Neurologic    Sudden  weakness in arms or legs:     Sudden numbness in arms or legs:     Sudden onset of difficulty speaking or slurred speech:    Temporary loss of vision in one eye:     Problems with dizziness:         Gastrointestinal    Blood in stool:     Vomited blood:         Genitourinary    Burning when urinating:     Blood in urine:        Psychiatric    Major depression:         Hematologic    Bleeding problems:    Problems with blood clotting too easily:        Skin    Rashes or ulcers:        Constitutional    Fever or chills:      PHYSICAL EXAMINATION:  Vitals:   12/13/23 0920  BP: (!) 138/97  Pulse: (!) 51  Temp: 98.4 F (36.9 C)  TempSrc: Temporal  SpO2: 98%  Weight: 145 lb 12.8 oz (66.1 kg)    General:  WDWN in NAD; vital signs documented above Gait: Normal HENT: WNL, normocephalic Pulmonary: normal non-labored breathing without wheezing Cardiac: regular HR Abdomen: soft Vascular Exam/Pulses: 2+ femoral, 2+ left DP and PT, Right DP 2+, PT not palpable. Feet warm and well perfused Extremities: without ischemic changes, without Gangrene , without cellulitis; without open wounds;  Musculoskeletal: no muscle wasting or atrophy  Neurologic: A&O X 3 Psychiatric:  The pt has Normal affect.   Non-Invasive Vascular Imaging:   +-------+-----------+-----------+------------+------------+  ABI/TBIToday's ABIToday's TBIPrevious ABIPrevious TBI  +-------+-----------+-----------+------------+------------+  Right 0.63       0.35       0.76        0.4           +-------+-----------+-----------+------------+------------+  Left  0.91       0.75  0.61        0.37          +-------+-----------+-----------+------------+------------+   VAS US  Aorta/IVC/Iliacs: Summary:  Stenosis: Patent stent with velocity in the proximal section in the >70% range. Triphasic flow  ASSESSMENT/PLAN:: 63 y.o. male here for follow up for PAD. On 10/21/23 he underwent Aortogram,  arteriogram of LLE with DCB angioplasty of his left popliteal artery and stenting of his left EIA by Dr. Susi Eric. This was for rest pain. Noted to have three vessel runoff. He has had resolution of his rest pain. No claudication or tissue loss. Some minor swelling after changing a tire yesterday.  - Encourage walking regimen - elevate legs as needed for swelling - continue Aspirin , statin, Plavix  -He will follow up in 6 months with ABI and repeat aorto/iliac duplex   Deneen Finical, PA-C [facility] [phone]  Clinic MD:  Susi Eric

## 2023-12-19 ENCOUNTER — Other Ambulatory Visit: Payer: Self-pay

## 2023-12-19 DIAGNOSIS — I70222 Atherosclerosis of native arteries of extremities with rest pain, left leg: Secondary | ICD-10-CM

## 2024-01-10 ENCOUNTER — Ambulatory Visit: Admitting: Cardiology

## 2024-01-10 ENCOUNTER — Telehealth: Payer: Self-pay | Admitting: Cardiology

## 2024-01-10 DIAGNOSIS — I2510 Atherosclerotic heart disease of native coronary artery without angina pectoris: Secondary | ICD-10-CM

## 2024-01-10 MED ORDER — NITROGLYCERIN 0.4 MG SL SUBL
0.4000 mg | SUBLINGUAL_TABLET | SUBLINGUAL | 2 refills | Status: AC | PRN
Start: 2024-01-10 — End: ?

## 2024-01-10 NOTE — Telephone Encounter (Signed)
 Pt's medication was sent to pt's pharmacy as requested. Confirmation received.

## 2024-01-10 NOTE — Telephone Encounter (Signed)
 Patient came in today thinking he had an appt with Dr. Sandee Crook- patient would like to wait until Dr. Sandee Crook returns to be seen so it has been rescheduled until August. Patient requests that his nitroglycerin  be refilled- preferred pharmacy is [facility] on [address] number [phone] (wife Adell Hones)  Thank you

## 2024-02-07 ENCOUNTER — Encounter: Payer: Self-pay | Admitting: Cardiology

## 2024-02-17 ENCOUNTER — Ambulatory Visit: Admitting: Cardiology

## 2024-03-27 ENCOUNTER — Ambulatory Visit: Admitting: Cardiology

## 2024-04-05 NOTE — Progress Notes (Unsigned)
 Cardiology Office Note:    Date:  04/10/2024   ID:  Jonathan Fleming, DOB 20-Mar-1961, MRN [id number]  PCP:  Jonathan Lamar CROME, MD  Cardiologist:  Jonathan Leiter, MD    Referring MD: Jonathan Lamar CROME, MD please do LP(a) with his next labs   ASSESSMENT:    1. CAD in native artery   2. Primary hypertension   3. Mixed hyperlipidemia   4. Peripheral vascular disease (HCC)   5. Current every day vaping    PLAN:    In order of problems listed above:  As he continues to do well from a cardiac perspective on appropriate treatment long-term dual antiplatelet lipid-lowering and antihypertensives.  At this time I would not advise an ischemia evaluation Well-controlled patient ACE thiazide calcium  channel blocker Lipids are at target continue high intensity statin has a marked family history of premature CAD I offered him screening for LP(a) necessity to have it done with his PCP with his next labs Markedly improved managed by vascular surgery [place]  Next appointment: 1 year   Medication Adjustments/Labs and Tests Ordered: Current medicines are reviewed at length with the patient today.  Concerns regarding medicines are outlined above.  Orders Placed This Encounter  Procedures   EKG 12-Lead   No orders of the defined types were placed in this encounter.    History of Present Illness:    Jonathan Fleming is a 63 y.o. male with a hx of CAD with non-ST elevation MI 1997 PCI with PCI and stent to the LAD and subsequent stent left circumflex coronary and stent LAD December 2017 peripheral arterial disease with PCI and stent left external iliac artery 2021 hypertension and dyslipidemia last seen 01/08/2023.  His last lipid profiles are 04/05/2023 cholesterol 92 LDL 31 non-HDL cholesterol 52  In March he had PCI left lower extremity for limb ischemia with drug-coated balloon angioplasty left popliteal artery and stenting left iliac artery  Compliance with diet, lifestyle and medications:  Yes  Quite pleased with the quality of his life since his last PCI and intervention and is lower extremity He continues not to smoke compliant his medications tolerates lipid-lowering without muscle pain or weakness And no longer has claudication no angina shortness of breath palpitation or syncope Past Medical History:  Diagnosis Date   Angina pectoris (HCC) 07/18/2016   Added automatically from request for surgery [id number]   CAD in native artery 02/21/2016   1997 DX/RX: Age 34. NSTEMI Inferior, Stent Circ x 2. MID LAD 70-80%  2009 Cardiolite Neg Coronary angiography 07/19/16: Conclusions Diagnostic Summary Culprit lesion is prox LAD--95%+ with TIMI II flow. Cx stent 60% in stent stenosis. Mid RCA has 65% lesion, R-L collaterals to LAD. LV gram shows moderate antero=apical hypokinesis--EF 40-45% Diagnostic Recommendations PCI recommended Interventional   Cholelithiasis 07/14/2016   Current every day smoker 07/14/2016   Dyslipidemia 02/21/2016   Family history of premature coronary artery disease 02/07/2018   Parents, brothers, sisters very young   H/O heart artery stent 03/04/2017   History of angina pectoris 07/18/2016   Formatting of this note might be different from the original.  Added automatically from request for surgery [id number]   History of non-ST elevation myocardial infarction (NSTEMI) 03/27/2020   Hyperlipidemia    Hypertension    LV dysfunction 04/03/2018   Old MI (myocardial infarction) 03/04/2017   Pain of left thigh 09/11/2021   Formatting of this note might be different from the original.  05/2021: ED  09/11/2021 : normal exam  Peripheral vascular disease (HCC) 02/21/2016   2007 DX/RX: GEST (+) RLE claudication. s/p angioplasty Rt. Sup. Femoral artery   Prediabetes 02/21/2016   2018: 127/5.5  Formatting of this note might be different from the original. 2018: 127/5.5   Screening for colon cancer 01/22/2018   Formatting of this note might be different from the original.   03/30/2021: confirmed, reviewed options and sx to watch for    Current Medications: Current Meds  Medication Sig   amLODipine  (NORVASC ) 5 MG tablet TAKE 1 TABLET BY MOUTH EVERY DAY   aspirin  EC 81 MG tablet Take 1 tablet (81 mg total) by mouth daily.   clonazePAM (KLONOPIN) 0.5 MG tablet Take 0.5 mg by mouth at bedtime.   clopidogrel  (PLAVIX ) 75 MG tablet TAKE 1 TABLET(75 MG) BY MOUTH DAILY   enalapril  (VASOTEC ) 10 MG tablet Take 10 mg by mouth 2 (two) times daily.   hydrochlorothiazide (MICROZIDE) 12.5 MG capsule Take 12.5 mg by mouth daily.   metoprolol  tartrate (LOPRESSOR ) 100 MG tablet Take 100 mg by mouth 2 (two) times daily.    Multiple Vitamin (MULITIVITAMIN WITH MINERALS) TABS Take 1 tablet by mouth daily.   nitroGLYCERIN  (NITROSTAT ) 0.4 MG SL tablet Place 1 tablet (0.4 mg total) under the tongue every 5 (five) minutes as needed for chest pain.   rosuvastatin  (CRESTOR ) 40 MG tablet Take 40 mg by mouth every evening.    traMADol  (ULTRAM ) 50 MG tablet Take 1 tablet (50 mg total) by mouth every 6 (six) hours as needed.      EKGs/Labs/Other Studies Reviewed:    The following studies were reviewed today:  Cardiac Studies & Procedures   ______________________________________________________________________________________________   STRESS TESTS  ECHOCARDIOGRAM STRESS TEST 04/11/2018  Narrative *[facility]* [address] [place], [place] [address] [phone]  ------------------------------------------------------------------- Stress Echocardiography  Patient:    Jonathan Fleming MR #:       [id number] Study Date: 04/11/2018 Gender:     M Age:        57 Height:     165.1 cm Weight:     69 kg BSA:        1.79 m^2 Pt. Status: Room:  ATTENDING    Jonathan Leiter, MD ORDERING     Jonathan Leiter, MD REFERRING    Jonathan Leiter, MD SONOGRAPHER  Jonathan Fleming, RDCS PERFORMING   [facility],  [place]  cc:  -------------------------------------------------------------------  ------------------------------------------------------------------- Indications:      Chest pain 786.51.  ------------------------------------------------------------------- History:   PMH:  No prior cardiac history.  Coronary artery disease.  ------------------------------------------------------------------- Study Conclusions  - Stress ECG conclusions: There were no stress arrhythmias or conduction abnormalities. The stress ECG was negative for ischemia. - Staged echo: There was no echocardiographic evidence for stress-induced ischemia.  Impressions:  - 1. Stress echo inconclusive. Patient achieved submaximal heart rate requested the test to be terminated because of claudication pain in his lower extremities. 2. The stress echo images at rest and stress were within normal limits. There was good augmentation and wall motion at the peak of exercise. 3. Fair exercise capacity. There were no significant other symptoms or EKG changes.  ------------------------------------------------------------------- Study data:   Study status:  Routine.  Consent:  The risks, benefits, and alternatives to the procedure were explained to the patient and informed consent was obtained.  Procedure:  The patient reported no pain pre or post test. Initial setup. The patient was brought to the laboratory. A baseline ECG was recorded. Surface ECG leads  and automatic cuff blood pressure measurements were monitored. Treadmill exercise testing was performed using the Bruce protocol. The patient exercised for 6 min 24 sec, to protocol stage 2, to a maximal work rate of 7 mets. Exercise was terminated due to moderate fatigue. The patient was positioned for image acquisition and recovery monitoring. Transthoracic stress echocardiography. Image quality was adequate. Images were captured at baseline and peak exercise.   Study completion:  The patient tolerated the procedure well. There were no complications.          Bruce protocol. Stress echocardiography.  Birthdate:  Patient birthdate: 08/05/1961.  Age:  Patient is 63 yr old.  Sex:  Gender: male. BMI: 25.3 kg/m^2.  Blood pressure:     126/76  Patient status: Outpatient.  Study date:  Study date: 04/11/2018. Study time: 02:13 PM.  -------------------------------------------------------------------  ------------------------------------------------------------------- Baseline ECG:  Normal.  ------------------------------------------------------------------- Stress protocol:  +--------+---------------+---+-------------+--------+ !Stage   !Time into phase!HR !BP (mmHg)    !Symptoms! +--------+---------------+---+-------------+--------+ !Baseline!---------------!61 !127/78 (94)  !None    ! +--------+---------------+---+-------------+--------+ !Stage 1 !---------------!92 !-------------!--------! +--------+---------------+---+-------------+--------+ !Stage 2 !---------------!122!196/106 (136)!--------! +--------+---------------+---+-------------+--------+ !Recovery!24 min 29 sec  !63 !174/91 (119) !--------! +--------+---------------+---+-------------+--------+  ------------------------------------------------------------------- Stress results:   Maximal heart rate during stress was 122 bpm (74% of maximal predicted heart rate). The maximal predicted heart rate was 164 bpm.The target heart rate was achieved. The heart rate response to stress was normal. There was a normal resting blood pressure with an appropriate response to stress. The rate-pressure product for the peak heart rate and blood pressure was 76087 mm Hg/min.  The patient experienced no chest pain during stress.  ------------------------------------------------------------------- Stress ECG:  There were no stress arrhythmias or conduction abnormalities.  The stress ECG was negative  for ischemia.  ------------------------------------------------------------------- Baseline:  - The estimated LV ejection fraction was 60%. - Normal wall motion; no LV regional wall motion abnormalities.  Peak stress:  - The estimated LV ejection fraction was 70%. - Normal wall motion; no LV regional wall motion abnormalities.  ------------------------------------------------------------------- Stress echo results:     Left ventricular ejection fraction was normal at rest and with stress. There was no echocardiographic evidence for stress-induced ischemia.  ------------------------------------------------------------------- Measurements  Left ventricle                         Value        Reference LV ID, ED, PLAX chordal        (L)     40    mm     43 - 52 LV ID, ES, PLAX chordal                26    mm     23 - 38 LV fx shortening, PLAX chordal         35    %      >=29 LV PW thickness, ED                    9     mm     --------- IVS/LV PW ratio, ED                    1            <=1.3  Ventricular septum                     Value        Reference IVS thickness, ED  9     mm     ---------  Aorta                                  Value        Reference Aortic root ID, ED                     29    mm     ---------  Left atrium                            Value        Reference LA ID, A-P, ES                         34    mm     --------- LA ID/bsa, A-P                         1.9   cm/m^2 <=2.2  Tricuspid valve                        Value        Reference Tricuspid regurg peak velocity         244   cm/s   --------- Tricuspid peak RV-RA gradient          24    mm Hg  ---------  Legend: (L)  and  (H)  mark values outside specified reference range.  ------------------------------------------------------------------- Prepared and Electronically Authenticated by  Jennifer Crape, MD 2019-08-30T16:53:11             ______________________________________________________________________________________________      EKG Interpretation Date/Time:  Friday April 10 2024 13:03:44 EDT Ventricular Rate:  59 PR Interval:  164 QRS Duration:  80 QT Interval:  408 QTC Calculation: 403 R Axis:   9  Text Interpretation: Sinus bradycardia ABNORMAL R WAVE PROGRESSION Abnormal ECG Nonspecific T wave abnormality no longer evident in Lateral leads Confirmed by Monetta Rogue ([id number]) on 04/10/2024 1:11:40 PM   Recent Labs: 10/21/2023: BUN 11; Creatinine, Ser 1.10; Hemoglobin 13.6; Potassium 4.9; Sodium 140  Recent Lipid Panel No results found for: CHOL, TRIG, HDL, CHOLHDL, VLDL, LDLCALC, LDLDIRECT  Physical Exam:    VS:  BP 110/64   Pulse (!) 59   Ht 5' 5 (1.651 m)   Wt 142 lb 12.8 oz (64.8 kg)   SpO2 95%   BMI 23.76 kg/m     Wt Readings from Last 3 Encounters:  04/10/24 142 lb 12.8 oz (64.8 kg)  12/13/23 145 lb 12.8 oz (66.1 kg)  10/21/23 140 lb (63.5 kg)     GEN:  Well nourished, well developed in no acute distress HEENT: Normal NECK: No JVD; No carotid bruits LYMPHATICS: No lymphadenopathy CARDIAC: RRR, no murmurs, rubs, gallops RESPIRATORY:  Clear to auscultation without rales, wheezing or rhonchi  ABDOMEN: Soft, non-tender, non-distended MUSCULOSKELETAL:  No edema; No deformity  SKIN: Warm and dry NEUROLOGIC:  Alert and oriented x 3 PSYCHIATRIC:  Normal affect    Signed, Rogue Monetta, MD  04/10/2024 1:15 PM    [facility]

## 2024-04-10 ENCOUNTER — Ambulatory Visit: Attending: Cardiology | Admitting: Cardiology

## 2024-04-10 ENCOUNTER — Encounter: Payer: Self-pay | Admitting: Cardiology

## 2024-04-10 VITALS — BP 110/64 | HR 59 | Ht 65.0 in | Wt 142.8 lb

## 2024-04-10 DIAGNOSIS — E782 Mixed hyperlipidemia: Secondary | ICD-10-CM

## 2024-04-10 DIAGNOSIS — I739 Peripheral vascular disease, unspecified: Secondary | ICD-10-CM

## 2024-04-10 DIAGNOSIS — I2510 Atherosclerotic heart disease of native coronary artery without angina pectoris: Secondary | ICD-10-CM

## 2024-04-10 DIAGNOSIS — Z7289 Other problems related to lifestyle: Secondary | ICD-10-CM

## 2024-04-10 DIAGNOSIS — I10 Essential (primary) hypertension: Secondary | ICD-10-CM

## 2024-04-10 NOTE — Patient Instructions (Signed)

## 2024-06-09 ENCOUNTER — Other Ambulatory Visit: Payer: Self-pay | Admitting: Vascular Surgery

## 2024-06-09 DIAGNOSIS — I739 Peripheral vascular disease, unspecified: Secondary | ICD-10-CM

## 2024-06-09 DIAGNOSIS — I70222 Atherosclerosis of native arteries of extremities with rest pain, left leg: Secondary | ICD-10-CM

## 2024-06-19 ENCOUNTER — Ambulatory Visit

## 2024-06-19 ENCOUNTER — Encounter (HOSPITAL_COMMUNITY)

## 2024-07-16 NOTE — Progress Notes (Signed)
 Office Note     CC:  follow up Requesting Provider:  Ofilia Lamar CROME, MD  HPI: Jonathan Fleming is a 63 y.o. (August 07, 1961) male who presents for surveillance follow up of PAD. He has history of left external iliac artery stenting by Dr. Eliza in June of 2021. This was for LLE claudication. Following his stent he had some recurrent in stent restenosis with claudication but had declined intervention. However when he was seen for follow up with Dr. Pearline in February of this year and he had progressed to having rest pain and was agreeable to intervention. On 10/21/23 he underwent Aortogram, arteriogram of LLE with DCB angioplasty of his left popliteal artery and stenting of his left EIA by Dr. Pearline. Noted to have three vessel runoff.    When he was seen in follow up in May he was without any rest pain, claudication or tissue loss. Was having some leg swelling but otherwise was doing very well. Duplex indicated some elevated velocities within the stent but otherwise patent and ABI's were improved on the LLE.   Today he is here with his wife. He says he is overall doing well. Denies any pain on ambulation or rest. No tissue loss. He does report that he has been having some recurrent plantar left foot pain as well as restlessness in the left leg. Happens upon first trying to lay down at night mostly. Was previously treated with some medication earlier this year prescribed by PCP that helped but he says it is coming back again. He is medically managed on Aspirin , Plavix , and statin. He is not diabetic. He takes CCB, ACE, HCTZ for hypertension. Former smoker quit in May of 2024.   Past Medical History:  Diagnosis Date   Angina pectoris 07/18/2016   Added automatically from request for surgery [id number]   CAD in native artery 02/21/2016   1997 DX/RX: Age 64. NSTEMI Inferior, Stent Circ x 2. MID LAD 70-80%  2009 Cardiolite Neg Coronary angiography 07/19/16: Conclusions Diagnostic Summary Culprit lesion is  prox LAD--95%+ with TIMI II flow. Cx stent 60% in stent stenosis. Mid RCA has 65% lesion, R-L collaterals to LAD. LV gram shows moderate antero=apical hypokinesis--EF 40-45% Diagnostic Recommendations PCI recommended Interventional   Cholelithiasis 07/14/2016   Current every day smoker 07/14/2016   Dyslipidemia 02/21/2016   Family history of premature coronary artery disease 02/07/2018   Parents, brothers, sisters very young   H/O heart artery stent 03/04/2017   History of angina pectoris 07/18/2016   Formatting of this note might be different from the original.  Added automatically from request for surgery [id number]   History of non-ST elevation myocardial infarction (NSTEMI) 03/27/2020   Hyperlipidemia    Hypertension    LV dysfunction 04/03/2018   Old MI (myocardial infarction) 03/04/2017   Pain of left thigh 09/11/2021   Formatting of this note might be different from the original.  05/2021: ED  09/11/2021 : normal exam   Peripheral vascular disease 02/21/2016   2007 DX/RX: GEST (+) RLE claudication. s/p angioplasty Rt. Sup. Femoral artery   Prediabetes 02/21/2016   2018: 127/5.5  Formatting of this note might be different from the original. 2018: 127/5.5   Screening for colon cancer 01/22/2018   Formatting of this note might be different from the original.  03/30/2021: confirmed, reviewed options and sx to watch for    Past Surgical History:  Procedure Laterality Date   ABDOMINAL AORTOGRAM W/LOWER EXTREMITY Bilateral 02/05/2020   Procedure: ABDOMINAL AORTOGRAM W/LOWER EXTREMITY;  Surgeon: Eliza Lonni RAMAN, MD;  Location: [facility] INVASIVE CV LAB;  Service: Cardiovascular;  Laterality: Bilateral;   ABDOMINAL AORTOGRAM W/LOWER EXTREMITY N/A 10/21/2023   Procedure: ABDOMINAL AORTOGRAM W/LOWER EXTREMITY;  Surgeon: Pearline Norman RAMAN, MD;  Location: [facility] INVASIVE CV LAB;  Service: Cardiovascular;  Laterality: N/A;   APPENDECTOMY     CORONARY ANGIOPLASTY     Stents x 2 age 40   LOWER EXTREMITY  INTERVENTION Left 10/21/2023   Procedure: LOWER EXTREMITY INTERVENTION;  Surgeon: Pearline Norman RAMAN, MD;  Location: [facility] INVASIVE CV LAB;  Service: Cardiovascular;  Laterality: Left;  SFA - DCB, [place] - Stent   LUMBAR LAMINECTOMY/DECOMPRESSION MICRODISCECTOMY  10/01/2011   Procedure: LUMBAR LAMINECTOMY/DECOMPRESSION MICRODISCECTOMY 1 LEVEL;  Surgeon: Reyes JONETTA Budge, MD;  Location: [facility] NEURO ORS;  Service: Neurosurgery;  Laterality: Right;  Right Lumbar two-three discectomy   PERIPHERAL VASCULAR INTERVENTION Left 02/05/2020   Procedure: PERIPHERAL VASCULAR INTERVENTION;  Surgeon: Eliza Lonni RAMAN, MD;  Location: [facility] INVASIVE CV LAB;  Service: Cardiovascular;  Laterality: Left;  EXT ILIAC    Social History   Socioeconomic History   Marital status: Married    Spouse name: Not on file   Number of children: Not on file   Years of education: Not on file   Highest education level: Not on file  Occupational History   Not on file  Tobacco Use   Smoking status: Former    Current packs/day: 0.00    Average packs/day: 0.5 packs/day for 20.0 years (10.0 ttl pk-yrs)    Types: Cigarettes    Start date: 06/23/1999    Quit date: 06/23/2019    Years since quitting: 5.0   Smokeless tobacco: Never  Vaping Use   Vaping status: Never Used  Substance and Sexual Activity   Alcohol use: No   Drug use: No   Sexual activity: Not on file  Other Topics Concern   Not on file  Social History Narrative   Not on file   Social Drivers of Health   Financial Resource Strain: Not on file  Food Insecurity: Low Risk  (05/15/2024)   Received from [facility]   Hunger Vital Sign    Within the past 12 months, you worried that your food would run out before you got money to buy more: Never true    Within the past 12 months, the food you bought just didn't last and you didn't have money to get more. : Never true  Transportation Needs: No Transportation Needs (05/15/2024)   Received from [employer]    In the past 12 months, has lack of reliable transportation kept you from medical appointments, meetings, work or from getting things needed for daily living? : No  Physical Activity: Not on file  Stress: Not on file  Social Connections: Not on file  Intimate Partner Violence: Not on file    Family History  Problem Relation Age of Onset   Diabetes Mother 66   Coronary artery disease Mother    Hypertension Mother    Coronary artery disease Father 47   Breast cancer Sister    Coronary artery disease Brother 40   Coronary artery disease Brother 37   Anesthesia problems Neg Hx     Current Outpatient Medications  Medication Sig Dispense Refill   amLODipine  (NORVASC ) 5 MG tablet TAKE 1 TABLET BY MOUTH EVERY DAY 180 tablet 2   aspirin  EC 81 MG tablet Take 1 tablet (81 mg total) by mouth daily. 90 tablet 3  clonazePAM (KLONOPIN) 0.5 MG tablet Take 0.5 mg by mouth at bedtime.     clopidogrel  (PLAVIX ) 75 MG tablet TAKE 1 TABLET(75 MG) BY MOUTH DAILY 90 tablet 2   enalapril  (VASOTEC ) 10 MG tablet Take 10 mg by mouth 2 (two) times daily.     hydrochlorothiazide (MICROZIDE) 12.5 MG capsule Take 12.5 mg by mouth daily.     metoprolol  tartrate (LOPRESSOR ) 100 MG tablet Take 100 mg by mouth 2 (two) times daily.      Multiple Vitamin (MULITIVITAMIN WITH MINERALS) TABS Take 1 tablet by mouth daily.     nitroGLYCERIN  (NITROSTAT ) 0.4 MG SL tablet Place 1 tablet (0.4 mg total) under the tongue every 5 (five) minutes as needed for chest pain. 25 tablet 2   rosuvastatin  (CRESTOR ) 40 MG tablet Take 40 mg by mouth every evening.      traMADol  (ULTRAM ) 50 MG tablet Take 1 tablet (50 mg total) by mouth every 6 (six) hours as needed. 20 tablet 0   No current facility-administered medications for this visit.    Allergies  Allergen Reactions   Atorvastatin Other (See Comments)    Increased LFT Weakness per patient    Penicillins Nausea And Vomiting     REVIEW OF SYSTEMS:  Negative  unless noted in HPI [X]  denotes positive finding, [ ]  denotes negative finding Cardiac  Comments:  Chest pain or chest pressure:    Shortness of breath upon exertion:    Short of breath when lying flat:    Irregular heart rhythm:        Vascular    Pain in calf, thigh, or hip brought on by ambulation:    Pain in feet at night that wakes you up from your sleep:     Blood clot in your veins:    Leg swelling:         Pulmonary    Oxygen at home:    Productive cough:     Wheezing:         Neurologic    Sudden weakness in arms or legs:     Sudden numbness in arms or legs:     Sudden onset of difficulty speaking or slurred speech:    Temporary loss of vision in one eye:     Problems with dizziness:         Gastrointestinal    Blood in stool:     Vomited blood:         Genitourinary    Burning when urinating:     Blood in urine:        Psychiatric    Major depression:         Hematologic    Bleeding problems:    Problems with blood clotting too easily:        Skin    Rashes or ulcers:        Constitutional    Fever or chills:      PHYSICAL EXAMINATION:  Vitals:   07/17/24 0845  BP: (!) 157/94  Pulse: (!) 53  Temp: 97.8 F (36.6 C)  TempSrc: Temporal  Weight: 148 lb (67.1 kg)    General:  WDWN in NAD; vital signs documented above Gait: Normal HENT: WNL, normocephalic Pulmonary: normal non-labored breathing Cardiac: regular HR Abdomen: soft Vascular Exam/Pulses: 2+ radial, 2+ femoral, Doppler right PT/Pero, left DP/ PT. Feet warm and well perfused Extremities: without ischemic changes, without Gangrene , without cellulitis; without open wounds;  Musculoskeletal: no muscle wasting or atrophy  Neurologic: A&O X 3 Psychiatric:  The pt has Normal affect.   Non-Invasive Vascular Imaging:   VAS US  Aorta/Iliacs/IVC: Summary:  Stenosis: +-------------------+---------------+  Location           Stent            +-------------------+---------------+   Left External Iliac50-99% stenosis  +-------------------+---------------+   +-------+-----------+-----------+------------+------------+  ABI/TBIToday's ABIToday's TBIPrevious ABIPrevious TBI  +-------+-----------+-----------+------------+------------+  Right 0.63       0.60       0.63        0.35          +-------+-----------+-----------+------------+------------+  Left  0.91       0.64       0.91        0.75          +-------+-----------+-----------+------------+------------+   Bilateral ABIs appear essentially unchanged compared to prior study on 12/13/2023.    ASSESSMENT/PLAN:: 63 y.o. male here for surveillance follow up of PAD. He has history of left external iliac artery stenting by Dr. Eliza in June of 2021. This was for LLE claudication. Following his stent he had some recurrent in stent restenosis with claudication but had declined intervention. However when he was seen for follow up with Dr. Pearline in February of this year and he had progressed to having rest pain and was agreeable to intervention. On 10/21/23 he underwent Aortogram, arteriogram of LLE with DCB angioplasty of his left popliteal artery and stenting of his left EIA by Dr. Pearline. He is currently without claudication, rest pain or tissue loss. He is having some plantar left foot pain, suspect plantar fascitis as well as some restlessness. Recommend he follow up with PCP for this.  His ABI's today are stable bilaterally. Duplex shows stable elevated velocities in the EIA. No indication for intervention at this time. - encourage walking/ exercise regimen - continue Aspirin , statin, Plavix  - He will follow up again in 6 months with repeat ABI and Aorta/iliac/IVC duplex - He knows to call for earlier follow up should he have any new or worsening symptoms    Teretha Damme, PA-C [facility] [phone]  Clinic MD:   Pearline

## 2024-07-17 ENCOUNTER — Ambulatory Visit (HOSPITAL_COMMUNITY)
Admission: RE | Admit: 2024-07-17 | Discharge: 2024-07-17 | Disposition: A | Source: Ambulatory Visit | Attending: Surgery | Admitting: Surgery

## 2024-07-17 ENCOUNTER — Encounter: Payer: Self-pay | Admitting: Physician Assistant

## 2024-07-17 ENCOUNTER — Ambulatory Visit (INDEPENDENT_AMBULATORY_CARE_PROVIDER_SITE_OTHER): Admitting: Physician Assistant

## 2024-07-17 VITALS — BP 157/94 | HR 53 | Temp 97.8°F | Wt 148.0 lb

## 2024-07-17 DIAGNOSIS — I771 Stricture of artery: Secondary | ICD-10-CM | POA: Diagnosis not present

## 2024-07-17 DIAGNOSIS — I739 Peripheral vascular disease, unspecified: Secondary | ICD-10-CM

## 2024-07-17 DIAGNOSIS — I70222 Atherosclerosis of native arteries of extremities with rest pain, left leg: Secondary | ICD-10-CM | POA: Diagnosis not present

## 2024-07-17 LAB — VAS US ABI WITH/WO TBI
Left ABI: 0.91
Right ABI: 0.63

## 2024-07-29 ENCOUNTER — Other Ambulatory Visit: Payer: Self-pay | Admitting: Cardiology

## 2024-08-12 ENCOUNTER — Other Ambulatory Visit: Payer: Self-pay | Admitting: Cardiology

## 2025-01-29 ENCOUNTER — Ambulatory Visit (HOSPITAL_COMMUNITY)

## 2025-01-29 ENCOUNTER — Ambulatory Visit
